# Patient Record
Sex: Female | Born: 1995 | Race: White | Hispanic: Yes | Marital: Single | Smoking: Never smoker
Health system: Southern US, Community
[De-identification: ages and names within clinical notes are randomized; demographics above are authoritative.]

## PROBLEM LIST (undated history)

## (undated) DIAGNOSIS — N946 Dysmenorrhea, unspecified: Principal | ICD-10-CM

## (undated) DIAGNOSIS — Z7689 Persons encountering health services in other specified circumstances: Principal | ICD-10-CM

## (undated) HISTORY — PX: OTHER SURGICAL HISTORY: SHX169

## (undated) HISTORY — DX: Dysmenorrhea, unspecified: N94.6

## (undated) HISTORY — DX: Persons encountering health services in other specified circumstances: Z76.89

---
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
## 2013-08-26 ENCOUNTER — Ambulatory Visit (INDEPENDENT_AMBULATORY_CARE_PROVIDER_SITE_OTHER): Payer: Medicaid Other | Admitting: Adult Health

## 2013-08-26 ENCOUNTER — Encounter: Payer: Self-pay | Admitting: Adult Health

## 2013-08-26 VITALS — BP 124/80 | Ht 61.0 in | Wt 118.0 lb

## 2013-08-26 DIAGNOSIS — Z7689 Persons encountering health services in other specified circumstances: Secondary | ICD-10-CM

## 2013-08-26 DIAGNOSIS — Z3202 Encounter for pregnancy test, result negative: Secondary | ICD-10-CM

## 2013-08-26 DIAGNOSIS — N946 Dysmenorrhea, unspecified: Secondary | ICD-10-CM

## 2013-08-26 HISTORY — DX: Dysmenorrhea, unspecified: N94.6

## 2013-08-26 LAB — POCT URINE PREGNANCY: Preg Test, Ur: NEGATIVE

## 2013-08-26 MED ORDER — IBUPROFEN 800 MG PO TABS: 800.0000 mg | ORAL_TABLET | Freq: Three times a day (TID) | ORAL | Status: DC | PRN

## 2013-08-26 MED ORDER — NORGESTIM-ETH ESTRAD TRIPHASIC 0.18/0.215/0.25 MG-35 MCG PO TABS: 1.0000 | ORAL_TABLET | Freq: Every day | ORAL | Status: DC

## 2013-08-26 NOTE — Progress Notes (Signed)
Subjective:     Patient ID: Vicki Maxwell, female   DOB: 1996-03-08, 18 y.o.   MRN: [id number]  HPI Vicki Maxwell is a 18 year old Hispanic female in complaining of period cramps.She started at age 20-11 and they have been regular and last about 3-4 days but now are more cramp and painful, has felt nauseated at times.Denies migraines with aura.She has never had sex.  Review of Systems See HPI Reviewed past medical,surgical, social and family history. Reviewed medications and allergies.     Objective:   Physical Exam BP 124/80  Ht 5\' 1"  (1.549 m)  Wt 118 lb (53.524 kg)  BMI 22.31 kg/m2  LMP 08/02/2013   UPT negative, talk only, will try OCs to help with cramping,risk/ benefits discussed and she wants to try.  Assessment:     Dysmenorrhea Period management    Plan:     Rx tri sprintec disp 1 pack take 1 daily refilled x 1 year,start with next menses Rx motrin 800 mg #60 1 every 8 hours prn cramps with 1 refill Follow up in 3 months Review handout on OC use  Call with any questuions

## 2013-08-26 NOTE — Patient Instructions (Signed)
Oral Contraception Use Oral contraceptive pills (OCPs) are medicines taken to prevent pregnancy. OCPs work by preventing the ovaries from releasing eggs. The hormones in OCPs also cause the cervical mucus to thicken, preventing the sperm from entering the uterus. The hormones also cause the uterine lining to become thin, not allowing a fertilized egg to attach to the inside of the uterus. OCPs are highly effective when taken exactly as prescribed. However, OCPs do not prevent sexually transmitted diseases (STDs). Safe sex practices, such as using condoms along with an OCP, can help prevent STDs. Before taking OCPs, you may have a physical exam and Pap test. Your health care provider may also order blood tests if necessary. Your health care provider will make sure you are a good candidate for oral contraception. Discuss with your health care provider the possible side effects of the OCP you may be prescribed. When starting an OCP, it can take 2 to 3 months for the body to adjust to the changes in hormone levels in your body.  HOW TO TAKE ORAL CONTRACEPTIVE PILLS Your health care provider may advise you on how to start taking the first cycle of OCPs. Otherwise, you can:   Start on day 1 of your menstrual period. You will not need any backup contraceptive protection with this start time.   Start on the first Sunday after your menstrual period or the day you get your prescription. In these cases, you will need to use backup contraceptive protection for the first week.   Start the pill at any time of your cycle. If you take the pill within 5 days of the start of your period, you are protected against pregnancy right away. In this case, you will not need a backup form of birth control. If you start at any other time of your menstrual cycle, you will need to use another form of birth control for 7 days. If your OCP is the type called a minipill, it will protect you from pregnancy after taking it for 2 days (48  hours). After you have started taking OCPs:   If you forget to take 1 pill, take it as soon as you remember. Take the next pill at the regular time.   If you miss 2 or more pills, call your health care provider because different pills have different instructions for missed doses. Use backup birth control until your next menstrual period starts.   If you use a 28-day pack that contains inactive pills and you miss 1 of the last 7 pills (pills with no hormones), it will not matter. Throw away the rest of the nonhormone pills and start a new pill pack.  No matter which day you start the OCP, you will always start a new pack on that same day of the week. Have an extra pack of OCPs and a backup contraceptive method available in case you miss some pills or lose your OCP pack.  HOME CARE INSTRUCTIONS   Do not smoke.   Always use a condom to protect against STDs. OCPs do not protect against STDs.   Use a calendar to mark your menstrual period days.   Read the information and directions that came with your OCP. Talk to your health care provider if you have questions.  SEEK MEDICAL CARE IF:   You develop nausea and vomiting.   You have abnormal vaginal discharge or bleeding.   You develop a rash.   You miss your menstrual period.   You are losing   your hair.   You need treatment for mood swings or depression.   You get dizzy when taking the OCP.   You develop acne from taking the OCP.   You become pregnant.  SEEK IMMEDIATE MEDICAL CARE IF:   You develop chest pain.   You develop shortness of breath.   You have an uncontrolled or severe headache.   You develop numbness or slurred speech.   You develop visual problems.   You develop pain, redness, and swelling in the legs.  Document Released: 04/05/2011 Document Revised: 12/17/2012 Document Reviewed: 10/05/2012 [facility] Patient Information 2014 [employer] [place]. Start pills with next menses Follow up in 3  months Call any time with questions Try motrin

## 2013-11-25 ENCOUNTER — Ambulatory Visit (INDEPENDENT_AMBULATORY_CARE_PROVIDER_SITE_OTHER): Payer: Medicaid Other | Admitting: Adult Health

## 2013-11-25 ENCOUNTER — Encounter: Payer: Self-pay | Admitting: Adult Health

## 2013-11-25 VITALS — BP 102/64 | Ht 61.5 in | Wt 121.5 lb

## 2013-11-25 DIAGNOSIS — Z7689 Persons encountering health services in other specified circumstances: Secondary | ICD-10-CM

## 2013-11-25 HISTORY — DX: Persons encountering health services in other specified circumstances: Z76.89

## 2013-11-25 NOTE — Progress Notes (Signed)
Subjective:     Patient ID: Vicki HarmanLorena Kinnick, female   DOB: 1995/12/13, 18 y.o.   MRN: [id number]  HPI Era BumpersLorena is a 18 year old Hispanic female back for follow up after starting OCs for dysmenorrhea.  Review of Systems See HPI Reviewed past medical,surgical, social and family history. Reviewed medications and allergies.     Objective:   Physical Exam BP 102/64  Ht 5' 1.5" (1.562 m)  Wt 121 lb 8 oz (55.112 kg)  BMI 22.59 kg/m2  LMP 10/31/2013   Talk only, periods are on time and no more pain, so she is happy and wants to continue them.  Assessment:     Period management    Plan:     Continue OCs Follow up in April 2016 or before if needed   Pap at 4521

## 2013-11-25 NOTE — Patient Instructions (Signed)
Continue pills  Follow up in April

## 2014-06-14 ENCOUNTER — Other Ambulatory Visit: Payer: Self-pay | Admitting: Adult Health

## 2014-09-20 ENCOUNTER — Other Ambulatory Visit: Payer: Self-pay | Admitting: Adult Health

## 2014-11-03 ENCOUNTER — Ambulatory Visit: Payer: Medicaid Other | Admitting: Adult Health

## 2014-11-18 ENCOUNTER — Ambulatory Visit (INDEPENDENT_AMBULATORY_CARE_PROVIDER_SITE_OTHER): Payer: Medicaid Other | Admitting: Adult Health

## 2014-11-18 ENCOUNTER — Encounter: Payer: Self-pay | Admitting: Adult Health

## 2014-11-18 VITALS — BP 90/60 | HR 84 | Ht 64.2 in | Wt 129.4 lb

## 2014-11-18 DIAGNOSIS — Z309 Encounter for contraceptive management, unspecified: Secondary | ICD-10-CM | POA: Diagnosis not present

## 2014-11-18 DIAGNOSIS — Z7689 Persons encountering health services in other specified circumstances: Secondary | ICD-10-CM

## 2014-11-18 MED ORDER — NORGESTIM-ETH ESTRAD TRIPHASIC 0.18/0.215/0.25 MG-35 MCG PO TABS: 1.0000 | ORAL_TABLET | Freq: Every day | ORAL | Status: DC

## 2014-11-18 NOTE — Patient Instructions (Signed)
Take OCs daily  If has sex use condoms will get pap at 81

## 2014-11-18 NOTE — Progress Notes (Signed)
Subjective:     Patient ID: Vicki Maxwell, female   DOB: [birth year], 19 y.o.   MRN: [id number]  HPI Mahika is a 19 year old Hispanic female in to get OCs refilled, periods are better, no cramps, but has missed 2 months, due to needing refills.She had wisdom teeth removed yesterday.  Review of Systems  Patient denies any headaches, hearing loss, fatigue, blurred vision, shortness of breath, chest pain, abdominal pain, problems with bowel movements, urination, or intercourse(has not had sex). No joint pain or mood swings. Reviewed past medical,surgical, social and family history. Reviewed medications and allergies.     Objective:   Physical Exam BP 90/60 mmHg  Pulse 84  Ht 5' 4.2" (1.631 m)  Wt 129 lb 6.4 oz (58.695 kg)  BMI 22.06 kg/m2  LMP 11/13/2014  Skin warm and dry. Neck: mid line trachea, normal thyroid, good ROM, no lymphadenopathy noted. Lungs: clear to ausculation bilaterally. Cardiovascular: regular rate and rhythm.Discussed that she can call for refills or have pharmacy request, do not not take pills.She is thinking about sex in the future, discussed has to be on pills for 1 month before effective and use condoms for STD prevention ,can start back on pills Sunday.    Assessment:     Period management     Plan:     Rx tri sprintec disp 1 pack take 1 daily with 11 refills Return in December for pap Use condoms

## 2015-04-20 ENCOUNTER — Other Ambulatory Visit: Payer: Medicaid Other | Admitting: Adult Health

## 2015-05-11 ENCOUNTER — Other Ambulatory Visit: Payer: Medicaid Other | Admitting: Adult Health

## 2015-05-16 ENCOUNTER — Encounter: Payer: Self-pay | Admitting: Adult Health

## 2015-05-16 ENCOUNTER — Other Ambulatory Visit (HOSPITAL_COMMUNITY)
Admission: RE | Admit: 2015-05-16 | Discharge: 2015-05-16 | Disposition: A | Discharge status: Home / Self Care | Payer: BLUE CROSS/BLUE SHIELD | Source: Ambulatory Visit | Attending: Adult Health | Admitting: Adult Health

## 2015-05-16 ENCOUNTER — Ambulatory Visit (INDEPENDENT_AMBULATORY_CARE_PROVIDER_SITE_OTHER): Payer: BLUE CROSS/BLUE SHIELD | Admitting: Adult Health

## 2015-05-16 VITALS — BP 120/70 | HR 58 | Ht 60.0 in | Wt 140.5 lb

## 2015-05-16 DIAGNOSIS — Z01419 Encounter for gynecological examination (general) (routine) without abnormal findings: Secondary | ICD-10-CM | POA: Diagnosis not present

## 2015-05-16 DIAGNOSIS — Z113 Encounter for screening for infections with a predominantly sexual mode of transmission: Secondary | ICD-10-CM | POA: Diagnosis present

## 2015-05-16 DIAGNOSIS — Z7689 Persons encountering health services in other specified circumstances: Secondary | ICD-10-CM

## 2015-05-16 MED ORDER — NORGESTIM-ETH ESTRAD TRIPHASIC 0.18/0.215/0.25 MG-35 MCG PO TABS: 1.0000 | ORAL_TABLET | Freq: Every day | ORAL | Status: DC

## 2015-05-16 NOTE — Progress Notes (Signed)
Patient ID: Vicki Maxwell, female   DOB: 31-Dec-1995, 20 y.o.   MRN: [id number] History of Present Illness: Era BumpersLorena is a 20 year old His[panic female, in for well woman gyn exam and first pap, she is happy with her pills.  Current Medications, Allergies, Past Medical History, Past Surgical History, Family History and Social History were reviewed in [facility] record.     Review of Systems: Patient denies any headaches, hearing loss, fatigue, blurred vision, shortness of breath, chest pain, abdominal pain, problems with bowel movements, urination, or intercourse. No joint pain or mood swings.    Physical Exam:BP 120/70 mmHg  Pulse 58  Ht 5' (1.524 m)  Wt 140 lb 8 oz (63.73 kg)  BMI 27.44 kg/m2  LMP 05/04/2015 General:  Well developed, well nourished, no acute distress Skin:  Warm and dry Neck:  Midline trachea, normal thyroid, good ROM, no lymphadenopathy Lungs; Clear to auscultation bilaterally Breast:  No dominant palpable mass, retraction, or nipple discharge Cardiovascular: Regular rate and rhythm Abdomen:  Soft, non tender, no hepatosplenomegaly Pelvic:  External genitalia is normal in appearance, no lesions.  The vagina is normal in appearance. Urethra has no lesions or masses. The cervix is tiny and smooth, pap with GC/CHL performed.  Uterus is felt to be normal size, shape, and contour.  No adnexal masses or tenderness noted.Bladder is non tender, no masses felt. Extremities/musculoskeletal:  No swelling or varicosities noted, no clubbing or cyanosis Psych:  No mood changes, alert and cooperative,seems happy   Impression: Well woman gyn exam with pap Period management    Plan: Refilled tri sprintec x 1 year    Physical in 1 year, app in 3 if normal

## 2015-05-16 NOTE — Patient Instructions (Signed)
Continue tri sprintec Physical in 1 year, pap in 3 if normal

## 2015-05-17 LAB — CYTOLOGY - PAP

## 2017-04-30 NOTE — L&D Delivery Note (Signed)
Delivery Note At  a viable female was delivered via  (Presentation:vertex ;LOA  ).  APGAR:9 ,9 ; weight  .   Placenta status:spont ,shultz .  Cord:3vc  with the following complications:none .  Cord pH: n/a  Anesthesia:nne   Episiotomy:  nne Lacerations:  none Suture Repair: n/a Est. Blood Loss 200(mL):    Mom to postpartum.  Baby to Couplet care / Skin to Skin.  Wyvonnia DuskyMarie Candiace West 12/15/2017, 8:06 PM

## 2017-05-10 ENCOUNTER — Encounter: Payer: Self-pay | Admitting: Adult Health

## 2017-05-10 ENCOUNTER — Ambulatory Visit (INDEPENDENT_AMBULATORY_CARE_PROVIDER_SITE_OTHER): Payer: Medicaid Other | Admitting: Adult Health

## 2017-05-10 VITALS — BP 122/60 | HR 56 | Ht 61.0 in | Wt 129.0 lb

## 2017-05-10 DIAGNOSIS — N926 Irregular menstruation, unspecified: Secondary | ICD-10-CM | POA: Diagnosis not present

## 2017-05-10 DIAGNOSIS — Z3201 Encounter for pregnancy test, result positive: Secondary | ICD-10-CM

## 2017-05-10 DIAGNOSIS — Z3490 Encounter for supervision of normal pregnancy, unspecified, unspecified trimester: Secondary | ICD-10-CM

## 2017-05-10 DIAGNOSIS — O3680X0 Pregnancy with inconclusive fetal viability, not applicable or unspecified: Secondary | ICD-10-CM

## 2017-05-10 DIAGNOSIS — R112 Nausea with vomiting, unspecified: Secondary | ICD-10-CM | POA: Diagnosis not present

## 2017-05-10 LAB — POCT URINE PREGNANCY: PREG TEST UR: POSITIVE — AB

## 2017-05-10 MED ORDER — PRENATAL PLUS IRON 29-1 MG PO TABS: ORAL_TABLET | ORAL | 12 refills | Status: DC

## 2017-05-10 NOTE — Progress Notes (Signed)
Patient ID: Vicki Maxwell, female   DOB: [birth year], 22 y.o.   MRN: [id number] History of Present Illness:  Vicki BumpersLorena is a Hispanic 22 year old in for UPT, has missed a period and had 1+HPT. She is teary over this today, she does not think she wants a baby right now, boyfriend is fine.  Current Medications, Allergies, Past Medical History, Past Surgical History, Family History and Social History were reviewed in [facility] electronic medical record.     Review of Systems: +missed period with +HPT +nausea and vomited x 3     Physical Exam:BP 122/60 (BP Location: Left Arm, Patient Position: Sitting, Cuff Size: Normal)   Pulse (!) 56   Ht 5\' 1"  (1.549 m)   Wt 129 lb (58.5 kg)   LMP 03/08/2017   BMI 24.37 kg/m UPT +, about 9 weeks by LMP with EDD 12/13/17. General:  Well developed, well nourished, no acute distress Skin:  Warm and dry Neck:  Midline trachea, normal thyroid, good ROM, no lymphadenopathy Lungs; Clear to auscultation bilaterally Cardiovascular: Regular rate and rhythm Abdomen:  Soft, non tender, no hepatosplenomegaly Will get US in 1 week for dating.Did talk with her about adoption instead of termination as option, she is not sure what she will do yet.    Impression: 1. Pregnancy examination or test, positive result   2. Pregnancy, unspecified gestational age   13. Encounter to determine fetal viability of pregnancy, single or unspecified fetus       Plan: Meds ordered this encounter  Medications  . Prenatal Vit-Iron Carbonyl-FA (PRENATAL PLUS IRON) 29-1 MG TABS    Sig: Take 1 daily    Dispense:  30 tablet    Refill:  12    Order Specific Question:   Supervising Provider    Answer:   Lazaro ArmsEURE, LUTHER H [[id number]]  Eat often  Return in 1 week for dating US Review handout on First trimester and by [facility]

## 2017-05-10 NOTE — Patient Instructions (Signed)
First Trimester of Pregnancy The first trimester of pregnancy is from week 1 until the end of week 13 (months 1 through 3). A week after a sperm fertilizes an egg, the egg will implant on the wall of the uterus. This embryo will begin to develop into a baby. Genes from you and your partner will form the baby. The female genes will determine whether the baby will be a boy or a girl. At 6-8 weeks, the eyes and face will be formed, and the heartbeat can be seen on ultrasound. At the end of 12 weeks, all the baby's organs will be formed. Now that you are pregnant, you will want to do everything you can to have a healthy baby. Two of the most important things are to get good prenatal care and to follow your health care provider's instructions. Prenatal care is all the medical care you receive before the baby's birth. This care will help prevent, find, and treat any problems during the pregnancy and childbirth. Body changes during your first trimester Your body goes through many changes during pregnancy. The changes vary from woman to woman.  You may gain or lose a couple of pounds at first.  You may feel sick to your stomach (nauseous) and you may throw up (vomit). If the vomiting is uncontrollable, call your health care provider.  You may tire easily.  You may develop headaches that can be relieved by medicines. All medicines should be approved by your health care provider.  You may urinate more often. Painful urination may mean you have a bladder infection.  You may develop heartburn as a result of your pregnancy.  You may develop constipation because certain hormones are causing the muscles that push stool through your intestines to slow down.  You may develop hemorrhoids or swollen veins (varicose veins).  Your breasts may begin to grow larger and become tender. Your nipples may stick out more, and the tissue that surrounds them (areola) may become darker.  Your gums may bleed and may be  sensitive to brushing and flossing.  Dark spots or blotches (chloasma, mask of pregnancy) may develop on your face. This will likely fade after the baby is born.  Your menstrual periods will stop.  You may have a loss of appetite.  You may develop cravings for certain kinds of food.  You may have changes in your emotions from day to day, such as being excited to be pregnant or being concerned that something may go wrong with the pregnancy and baby.  You may have more vivid and strange dreams.  You may have changes in your hair. These can include thickening of your hair, rapid growth, and changes in texture. Some women also have hair loss during or after pregnancy, or hair that feels dry or thin. Your hair will most likely return to normal after your baby is born.  What to expect at prenatal visits During a routine prenatal visit:  You will be weighed to make sure you and the baby are growing normally.  Your blood pressure will be taken.  Your abdomen will be measured to track your baby's growth.  The fetal heartbeat will be listened to between weeks 10 and 14 of your pregnancy.  Test results from any previous visits will be discussed.  Your health care provider may ask you:  How you are feeling.  If you are feeling the baby move.  If you have had any abnormal symptoms, such as leaking fluid, bleeding, severe headaches,   or abdominal cramping.  If you are using any tobacco products, including cigarettes, chewing tobacco, and electronic cigarettes.  If you have any questions.  Other tests that may be performed during your first trimester include:  Blood tests to find your blood type and to check for the presence of any previous infections. The tests will also be used to check for low iron levels (anemia) and protein on red blood cells (Rh antibodies). Depending on your risk factors, or if you previously had diabetes during pregnancy, you may have tests to check for high blood  sugar that affects pregnant women (gestational diabetes).  Urine tests to check for infections, diabetes, or protein in the urine.  An ultrasound to confirm the proper growth and development of the baby.  Fetal screens for spinal cord problems (spina bifida) and Down syndrome.  HIV (human immunodeficiency virus) testing. Routine prenatal testing includes screening for HIV, unless you choose not to have this test.  You may need other tests to make sure you and the baby are doing well.  Follow these instructions at home: Medicines  Follow your health care provider's instructions regarding medicine use. Specific medicines may be either safe or unsafe to take during pregnancy.  Take a prenatal vitamin that contains at least 600 micrograms (mcg) of folic acid.  If you develop constipation, try taking a stool softener if your health care provider approves. Eating and drinking  Eat a balanced diet that includes fresh fruits and vegetables, whole grains, good sources of protein such as meat, eggs, or tofu, and low-fat dairy. Your health care provider will help you determine the amount of weight gain that is right for you.  Avoid raw meat and uncooked cheese. These carry germs that can cause birth defects in the baby.  Eating four or five small meals rather than three large meals a day may help relieve nausea and vomiting. If you start to feel nauseous, eating a few soda crackers can be helpful. Drinking liquids between meals, instead of during meals, also seems to help ease nausea and vomiting.  Limit foods that are high in fat and processed sugars, such as fried and sweet foods.  To prevent constipation: ? Eat foods that are high in fiber, such as fresh fruits and vegetables, whole grains, and beans. ? Drink enough fluid to keep your urine clear or pale yellow. Activity  Exercise only as directed by your health care provider. Most women can continue their usual exercise routine during  pregnancy. Try to exercise for 30 minutes at least 5 days a week. Exercising will help you: ? Control your weight. ? Stay in shape. ? Be prepared for labor and delivery.  Experiencing pain or cramping in the lower abdomen or lower back is a good sign that you should stop exercising. Check with your health care provider before continuing with normal exercises.  Try to avoid standing for long periods of time. Move your legs often if you must stand in one place for a long time.  Avoid heavy lifting.  Wear low-heeled shoes and practice good posture.  You may continue to have sex unless your health care provider tells you not to. Relieving pain and discomfort  Wear a good support bra to relieve breast tenderness.  Take warm sitz baths to soothe any pain or discomfort caused by hemorrhoids. Use hemorrhoid cream if your health care provider approves.  Rest with your legs elevated if you have leg cramps or low back pain.  If you develop   varicose veins in your legs, wear support hose. Elevate your feet for 15 minutes, 3-4 times a day. Limit salt in your diet. Prenatal care  Schedule your prenatal visits by the twelfth week of pregnancy. They are usually scheduled monthly at first, then more often in the last 2 months before delivery.  Write down your questions. Take them to your prenatal visits.  Keep all your prenatal visits as told by your health care provider. This is important. Safety  Wear your seat belt at all times when driving.  Make a list of emergency phone numbers, including numbers for family, friends, the hospital, and police and fire departments. General instructions  Ask your health care provider for a referral to a local prenatal education class. Begin classes no later than the beginning of month 6 of your pregnancy.  Ask for help if you have counseling or nutritional needs during pregnancy. Your health care provider can offer advice or refer you to specialists for help  with various needs.  Do not use hot tubs, steam rooms, or saunas.  Do not douche or use tampons or scented sanitary pads.  Do not cross your legs for long periods of time.  Avoid cat litter boxes and soil used by cats. These carry germs that can cause birth defects in the baby and possibly loss of the fetus by miscarriage or stillbirth.  Avoid all smoking, herbs, alcohol, and medicines not prescribed by your health care provider. Chemicals in these products affect the formation and growth of the baby.  Do not use any products that contain nicotine or tobacco, such as cigarettes and e-cigarettes. If you need help quitting, ask your health care provider. You may receive counseling support and other resources to help you quit.  Schedule a dentist appointment. At home, brush your teeth with a soft toothbrush and be gentle when you floss. Contact a health care provider if:  You have dizziness.  You have mild pelvic cramps, pelvic pressure, or nagging pain in the abdominal area.  You have persistent nausea, vomiting, or diarrhea.  You have a bad smelling vaginal discharge.  You have pain when you urinate.  You notice increased swelling in your face, hands, legs, or ankles.  You are exposed to fifth disease or chickenpox.  You are exposed to German measles (rubella) and have never had it. Get help right away if:  You have a fever.  You are leaking fluid from your vagina.  You have spotting or bleeding from your vagina.  You have severe abdominal cramping or pain.  You have rapid weight gain or loss.  You vomit blood or material that looks like coffee grounds.  You develop a severe headache.  You have shortness of breath.  You have any kind of trauma, such as from a fall or a car accident. Summary  The first trimester of pregnancy is from week 1 until the end of week 13 (months 1 through 3).  Your body goes through many changes during pregnancy. The changes vary from  woman to woman.  You will have routine prenatal visits. During those visits, your health care provider will examine you, discuss any test results you may have, and talk with you about how you are feeling. This information is not intended to replace advice given to you by your health care provider. Make sure you discuss any questions you have with your health care provider. Document Released: 04/10/2001 Document Revised: 03/28/2016 Document Reviewed: 03/28/2016 [employer] Interactive Patient Education  2018 [employer]  

## 2017-05-20 ENCOUNTER — Ambulatory Visit (INDEPENDENT_AMBULATORY_CARE_PROVIDER_SITE_OTHER): Payer: BLUE CROSS/BLUE SHIELD

## 2017-05-20 DIAGNOSIS — Z3A10 10 weeks gestation of pregnancy: Secondary | ICD-10-CM

## 2017-05-20 DIAGNOSIS — O3680X0 Pregnancy with inconclusive fetal viability, not applicable or unspecified: Secondary | ICD-10-CM | POA: Diagnosis not present

## 2017-05-20 NOTE — Progress Notes (Signed)
US 10+3 wks,single IUP,anterior pl gr 0,normal ovaries bilat,crl 37.26 mm,fhr 169 bpm

## 2017-05-29 ENCOUNTER — Ambulatory Visit (INDEPENDENT_AMBULATORY_CARE_PROVIDER_SITE_OTHER): Payer: BLUE CROSS/BLUE SHIELD | Admitting: Advanced Practice Midwife

## 2017-05-29 ENCOUNTER — Ambulatory Visit: Payer: BLUE CROSS/BLUE SHIELD | Admitting: *Deleted

## 2017-05-29 ENCOUNTER — Encounter: Payer: Self-pay | Admitting: Advanced Practice Midwife

## 2017-05-29 VITALS — BP 127/78 | HR 76 | Wt 131.0 lb

## 2017-05-29 DIAGNOSIS — Z23 Encounter for immunization: Secondary | ICD-10-CM

## 2017-05-29 DIAGNOSIS — Z3A11 11 weeks gestation of pregnancy: Secondary | ICD-10-CM | POA: Diagnosis not present

## 2017-05-29 DIAGNOSIS — Z3401 Encounter for supervision of normal first pregnancy, first trimester: Secondary | ICD-10-CM | POA: Diagnosis not present

## 2017-05-29 DIAGNOSIS — Z1389 Encounter for screening for other disorder: Secondary | ICD-10-CM

## 2017-05-29 DIAGNOSIS — Z3400 Encounter for supervision of normal first pregnancy, unspecified trimester: Secondary | ICD-10-CM | POA: Insufficient documentation

## 2017-05-29 DIAGNOSIS — Z331 Pregnant state, incidental: Secondary | ICD-10-CM

## 2017-05-29 LAB — POCT URINALYSIS DIPSTICK
Blood, UA: NEGATIVE
Glucose, UA: NEGATIVE
Ketones, UA: NEGATIVE
LEUKOCYTES UA: NEGATIVE
Nitrite, UA: NEGATIVE
PROTEIN UA: NEGATIVE

## 2017-05-29 NOTE — Patient Instructions (Addendum)
 First Trimester of Pregnancy The first trimester of pregnancy is from week 1 until the end of week 12 (months 1 through 3). A week after a sperm fertilizes an egg, the egg will implant on the wall of the uterus. This embryo will begin to develop into a baby. Genes from you and your partner are forming the baby. The female genes determine whether the baby is a boy or a girl. At 6-8 weeks, the eyes and face are formed, and the heartbeat can be seen on ultrasound. At the end of 12 weeks, all the baby's organs are formed.  Now that you are pregnant, you will want to do everything you can to have a healthy baby. Two of the most important things are to get good prenatal care and to follow your health care provider's instructions. Prenatal care is all the medical care you receive before the baby's birth. This care will help prevent, find, and treat any problems during the pregnancy and childbirth. BODY CHANGES Your body goes through many changes during pregnancy. The changes vary from woman to woman.   You may gain or lose a couple of pounds at first.  You may feel sick to your stomach (nauseous) and throw up (vomit). If the vomiting is uncontrollable, call your health care provider.  You may tire easily.  You may develop headaches that can be relieved by medicines approved by your health care provider.  You may urinate more often. Painful urination may mean you have a bladder infection.  You may develop heartburn as a result of your pregnancy.  You may develop constipation because certain hormones are causing the muscles that push waste through your intestines to slow down.  You may develop hemorrhoids or swollen, bulging veins (varicose veins).  Your breasts may begin to grow larger and become tender. Your nipples may stick out more, and the tissue that surrounds them (areola) may become darker.  Your gums may bleed and may be sensitive to brushing and flossing.  Dark spots or blotches  (chloasma, mask of pregnancy) may develop on your face. This will likely fade after the baby is born.  Your menstrual periods will stop.  You may have a loss of appetite.  You may develop cravings for certain kinds of food.  You may have changes in your emotions from day to day, such as being excited to be pregnant or being concerned that something may go wrong with the pregnancy and baby.  You may have more vivid and strange dreams.  You may have changes in your hair. These can include thickening of your hair, rapid growth, and changes in texture. Some women also have hair loss during or after pregnancy, or hair that feels dry or thin. Your hair will most likely return to normal after your baby is born. WHAT TO EXPECT AT YOUR PRENATAL VISITS During a routine prenatal visit:  You will be weighed to make sure you and the baby are growing normally.  Your blood pressure will be taken.  Your abdomen will be measured to track your baby's growth.  The fetal heartbeat will be listened to starting around week 10 or 12 of your pregnancy.  Test results from any previous visits will be discussed. Your health care provider may ask you:  How you are feeling.  If you are feeling the baby move.  If you have had any abnormal symptoms, such as leaking fluid, bleeding, severe headaches, or abdominal cramping.  If you have any questions. Other   tests that may be performed during your first trimester include:  Blood tests to find your blood type and to check for the presence of any previous infections. They will also be used to check for low iron levels (anemia) and Rh antibodies. Later in the pregnancy, blood tests for diabetes will be done along with other tests if problems develop.  Urine tests to check for infections, diabetes, or protein in the urine.  An ultrasound to confirm the proper growth and development of the baby.  An amniocentesis to check for possible genetic problems.  Fetal  screens for spina bifida and Down syndrome.  You may need other tests to make sure you and the baby are doing well. HOME CARE INSTRUCTIONS  Medicines  Follow your health care provider's instructions regarding medicine use. Specific medicines may be either safe or unsafe to take during pregnancy.  Take your prenatal vitamins as directed.  If you develop constipation, try taking a stool softener if your health care provider approves. Diet  Eat regular, well-balanced meals. Choose a variety of foods, such as meat or vegetable-based protein, fish, milk and low-fat dairy products, vegetables, fruits, and whole grain breads and cereals. Your health care provider will help you determine the amount of weight gain that is right for you.  Avoid raw meat and uncooked cheese. These carry germs that can cause birth defects in the baby.  Eating four or five small meals rather than three large meals a day may help relieve nausea and vomiting. If you start to feel nauseous, eating a few soda crackers can be helpful. Drinking liquids between meals instead of during meals also seems to help nausea and vomiting.  If you develop constipation, eat more high-fiber foods, such as fresh vegetables or fruit and whole grains. Drink enough fluids to keep your urine clear or pale yellow. Activity and Exercise  Exercise only as directed by your health care provider. Exercising will help you:  Control your weight.  Stay in shape.  Be prepared for labor and delivery.  Experiencing pain or cramping in the lower abdomen or low back is a good sign that you should stop exercising. Check with your health care provider before continuing normal exercises.  Try to avoid standing for long periods of time. Move your legs often if you must stand in one place for a long time.  Avoid heavy lifting.  Wear low-heeled shoes, and practice good posture.  You may continue to have sex unless your health care provider directs you  otherwise. Relief of Pain or Discomfort  Wear a good support bra for breast tenderness.   Take warm sitz baths to soothe any pain or discomfort caused by hemorrhoids. Use hemorrhoid cream if your health care provider approves.   Rest with your legs elevated if you have leg cramps or low back pain.  If you develop varicose veins in your legs, wear support hose. Elevate your feet for 15 minutes, 3-4 times a day. Limit salt in your diet. Prenatal Care  Schedule your prenatal visits by the twelfth week of pregnancy. They are usually scheduled monthly at first, then more often in the last 2 months before delivery.  Write down your questions. Take them to your prenatal visits.  Keep all your prenatal visits as directed by your health care provider. Safety  Wear your seat belt at all times when driving.  Make a list of emergency phone numbers, including numbers for family, friends, the hospital, and police and fire departments. General   Tips  Ask your health care provider for a referral to a local prenatal education class. Begin classes no later than at the beginning of month 6 of your pregnancy.  Ask for help if you have counseling or nutritional needs during pregnancy. Your health care provider can offer advice or refer you to specialists for help with various needs.  Do not use hot tubs, steam rooms, or saunas.  Do not douche or use tampons or scented sanitary pads.  Do not cross your legs for long periods of time.  Avoid cat litter boxes and soil used by cats. These carry germs that can cause birth defects in the baby and possibly loss of the fetus by miscarriage or stillbirth.  Avoid all smoking, herbs, alcohol, and medicines not prescribed by your health care provider. Chemicals in these affect the formation and growth of the baby.  Schedule a dentist appointment. At home, brush your teeth with a soft toothbrush and be gentle when you floss. SEEK MEDICAL CARE IF:   You have  dizziness.  You have mild pelvic cramps, pelvic pressure, or nagging pain in the abdominal area.  You have persistent nausea, vomiting, or diarrhea.  You have a bad smelling vaginal discharge.  You have pain with urination.  You notice increased swelling in your face, hands, legs, or ankles. SEEK IMMEDIATE MEDICAL CARE IF:   You have a fever.  You are leaking fluid from your vagina.  You have spotting or bleeding from your vagina.  You have severe abdominal cramping or pain.  You have rapid weight gain or loss.  You vomit blood or material that looks like coffee grounds.  You are exposed to German measles and have never had them.  You are exposed to fifth disease or chickenpox.  You develop a severe headache.  You have shortness of breath.  You have any kind of trauma, such as from a fall or a car accident. Document Released: 04/10/2001 Document Revised: 08/31/2013 Document Reviewed: 02/24/2013 [employer] Patient Information 2015 [employer]. This information is not intended to replace advice given to you by your health care provider. Make sure you discuss any questions you have with your health care provider.   Nausea & Vomiting  Have saltine crackers or pretzels by your bed and eat a few bites before you raise your head out of bed in the morning  Eat small frequent meals throughout the day instead of large meals  Drink plenty of fluids throughout the day to stay hydrated, just don't drink a lot of fluids with your meals.  This can make your stomach fill up faster making you feel sick  Do not brush your teeth right after you eat  Products with real ginger are good for nausea, like ginger ale and ginger hard candy Make sure it says made with real ginger!  Sucking on sour candy like lemon heads is also good for nausea  If your prenatal vitamins make you nauseated, take them at night so you will sleep through the nausea  Sea Bands  If you feel like you need  medicine for the nausea & vomiting please let us know  If you are unable to keep any fluids or food down please let us know   Constipation  Drink plenty of fluid, preferably water, throughout the day  Eat foods high in fiber such as fruits, vegetables, and grains  Exercise, such as walking, is a good way to keep your bowels regular  Drink warm fluids, especially warm   prune juice, or decaf coffee  Eat a 1/2 cup of real oatmeal (not instant), 1/2 cup applesauce, and 1/2-1 cup warm prune juice every day  If needed, you may take Colace (docusate sodium) stool softener once or twice a day to help keep the stool soft. If you are pregnant, wait until you are out of your first trimester (12-14 weeks of pregnancy)  If you still are having problems with constipation, you may take Miralax once daily as needed to help keep your bowels regular.  If you are pregnant, wait until you are out of your first trimester (12-14 weeks of pregnancy)  Safe Medications in Pregnancy   Acne: Benzoyl Peroxide Salicylic Acid  Backache/Headache: Tylenol: 2 regular strength every 4 hours OR              2 Extra strength every 6 hours  Colds/Coughs/Allergies: Benadryl (alcohol free) 25 mg every 6 hours as needed Breath right strips Claritin Cepacol throat lozenges Chloraseptic throat spray Cold-Eeze- up to three times per day Cough drops, alcohol free Flonase (by prescription only) Guaifenesin Mucinex Robitussin DM (plain only, alcohol free) Saline nasal spray/drops Sudafed (pseudoephedrine) & Actifed ** use only after [redacted] weeks gestation and if you do not have high blood pressure Tylenol Vicks Vaporub Zinc lozenges Zyrtec   Constipation: Colace Ducolax suppositories Fleet enema Glycerin suppositories Metamucil Milk of magnesia Miralax Senokot Smooth move tea  Diarrhea: Kaopectate Imodium A-D  *NO pepto Bismol  Hemorrhoids: Anusol Anusol HC Preparation  H Tucks  Indigestion: Tums Maalox Mylanta Zantac  Pepcid  Insomnia: Benadryl (alcohol free) 25mg  every 6 hours as needed Tylenol PM Unisom, no Gelcaps  Leg Cramps: Tums MagGel  Nausea/Vomiting:  Bonine Dramamine Emetrol Ginger extract Sea bands Meclizine  Nausea medication to take during pregnancy:  Unisom (doxylamine succinate 25 mg tablets) Take one tablet daily at bedtime. If symptoms are not adequately controlled, the dose can be increased to a maximum recommended dose of two tablets daily (1/2 tablet in the morning, 1/2 tablet mid-afternoon and one at bedtime). Vitamin B6 100mg  tablets. Take one tablet twice a day (up to 200 mg per day).  Skin Rashes: Aveeno products Benadryl cream or 25mg  every 6 hours as needed Calamine Lotion 1% cortisone cream  Yeast infection: Gyne-lotrimin 7 Monistat 7   **If taking multiple medications, please check labels to avoid duplicating the same active ingredients **take medication as directed on the label ** Do not exceed 4000 mg of tylenol in 24 hours **Do not take medications that contain aspirin or ibuprofen     [phone] is the phone number for Pregnancy Classes or hospital tours at [facility].   You will be referred to  [email or web address] for more information on childbirth classes  At this site you may register for classes. You may sign up for a waiting list if classes are full. Please SIGN UP FOR THIS!.   When the waiting list becomes long, sometimes new classes can be added.

## 2017-05-29 NOTE — Progress Notes (Signed)
  Subjective:    Vicki Maxwell is a G1P0 5340w5d being seen today for her first obstetrical visit.  Her obstetrical history is significant for first pregnancy.  Wasn't sure about it when she first found out, but is doing OK now. .  Pregnancy history fully reviewed.  Patient reports no complaints.  Vitals:   05/29/17 1458  BP: 127/78  Pulse: 76  Weight: 131 lb (59.4 kg)    HISTORY: OB History  Gravida Para Term Preterm AB Living  1            SAB TAB Ectopic Multiple Live Births               # Outcome Date GA Lbr Len/2nd Weight Sex Delivery Anes PTL Lv  1 Current              Past Medical History:  Diagnosis Date  . Dysmenorrhea 08/26/2013  . Menstrual extraction 11/25/2013   Past Surgical History:  Procedure Laterality Date  . removal wisdom tooth     Family History  Problem Relation Age of Onset  . Diabetes Father   . Hyperlipidemia Father   . Diabetes Paternal Grandmother   . Diabetes Paternal Grandfather   . Diabetes Paternal Aunt   . Diabetes Paternal Uncle   . Hyperlipidemia Cousin      Exam                                      System:     Skin: normal coloration and turgor, no rashes    Neurologic: oriented, normal, normal mood   Extremities: normal strength, tone, and muscle mass   HEENT PERRLA   Mouth/Teeth mucous membranes moist, normal dentition   Neck supple and no masses   Cardiovascular: regular rate and rhythm   Respiratory:  appears well, vitals normal, no respiratory distress, acyanotic   Abdomen: soft, non-tender;  FHR: 160 US        The nature of [facility] with multiple MDs and other Advanced Practice Providers was explained to patient; also emphasized that residents, students are part of our team.  Assessment:    Pregnancy: G1P0 Patient Active Problem List   Diagnosis Date Noted  . Supervision of normal first pregnancy 05/29/2017  . Menstrual extraction 11/25/2013  . Dysmenorrhea  08/26/2013        Plan:     Initial labs drawn. Continue prenatal vitamins  Problem list reviewed and updated  Reviewed n/v relief measures and warning s/s to report  Reviewed recommended weight gain based on pre-gravid BMI  Encouraged well-balanced diet Genetic Screening discussed Integrated Screen: declined.  Ultrasound discussed; fetal survey: requested.  Return in about 4 weeks (around 06/26/2017) for LROB.  CRESENZO-DISHMAN,Marcedes Tech 05/29/2017

## 2017-05-30 LAB — OBSTETRIC PANEL, INCLUDING HIV
Antibody Screen: NEGATIVE
BASOS: 0 %
Basophils Absolute: 0 10*3/uL (ref 0.0–0.2)
EOS (ABSOLUTE): 0 10*3/uL (ref 0.0–0.4)
Eos: 1 %
HEMATOCRIT: 39 % (ref 34.0–46.6)
HIV SCREEN 4TH GENERATION: NONREACTIVE
Hemoglobin: 13.7 g/dL (ref 11.1–15.9)
Hepatitis B Surface Ag: NEGATIVE
Immature Grans (Abs): 0 10*3/uL (ref 0.0–0.1)
Immature Granulocytes: 0 %
Lymphocytes Absolute: 2.1 10*3/uL (ref 0.7–3.1)
Lymphs: 24 %
MCH: 31.1 pg (ref 26.6–33.0)
MCHC: 35.1 g/dL (ref 31.5–35.7)
MCV: 88 fL (ref 79–97)
MONOCYTES: 6 %
Monocytes Absolute: 0.5 10*3/uL (ref 0.1–0.9)
NEUTROS ABS: 6.2 10*3/uL (ref 1.4–7.0)
Neutrophils: 69 %
Platelets: 262 10*3/uL (ref 150–379)
RBC: 4.41 x10E6/uL (ref 3.77–5.28)
RDW: 13.5 % (ref 12.3–15.4)
RPR Ser Ql: NONREACTIVE
RUBELLA: 1.35 {index} (ref >0.99)
Rh Factor: POSITIVE
WBC: 8.8 10*3/uL (ref 3.4–10.8)

## 2017-05-30 LAB — URINALYSIS, ROUTINE W REFLEX MICROSCOPIC
Bilirubin, UA: NEGATIVE
Glucose, UA: NEGATIVE
Ketones, UA: NEGATIVE
LEUKOCYTES UA: NEGATIVE
Nitrite, UA: POSITIVE — AB
PH UA: 5.5 (ref 5.0–7.5)
Protein, UA: NEGATIVE
RBC, UA: NEGATIVE
SPEC GRAV UA: 1.028 (ref 1.005–1.030)
Urobilinogen, Ur: 0.2 mg/dL (ref 0.2–1.0)

## 2017-05-30 LAB — PMP SCREEN PROFILE (10S), URINE
Amphetamine Scrn, Ur: NEGATIVE ng/mL
BARBITURATE SCREEN URINE: NEGATIVE ng/mL
BENZODIAZEPINE SCREEN, URINE: NEGATIVE ng/mL
CANNABINOIDS UR QL SCN: NEGATIVE ng/mL
COCAINE(METAB.)SCREEN, URINE: NEGATIVE ng/mL
CREATININE(CRT), U: 154.1 mg/dL (ref 20.0–300.0)
METHADONE SCREEN, URINE: NEGATIVE ng/mL
OPIATE SCREEN URINE: NEGATIVE ng/mL
OXYCODONE+OXYMORPHONE UR QL SCN: NEGATIVE ng/mL
PH UR, DRUG SCRN: 5.8 (ref 4.5–8.9)
PHENCYCLIDINE QUANTITATIVE URINE: NEGATIVE ng/mL
Propoxyphene Scrn, Ur: NEGATIVE ng/mL

## 2017-05-30 LAB — MICROSCOPIC EXAMINATION: Casts: NONE SEEN /lpf

## 2017-05-30 LAB — MED LIST OPTION NOT SELECTED

## 2017-06-01 LAB — GC/CHLAMYDIA PROBE AMP
CHLAMYDIA, DNA PROBE: NEGATIVE
NEISSERIA GONORRHOEAE BY PCR: NEGATIVE

## 2017-06-03 LAB — URINE CULTURE

## 2017-06-26 ENCOUNTER — Encounter: Payer: Self-pay | Admitting: Advanced Practice Midwife

## 2017-06-26 ENCOUNTER — Ambulatory Visit (INDEPENDENT_AMBULATORY_CARE_PROVIDER_SITE_OTHER): Payer: Medicaid Other | Admitting: Advanced Practice Midwife

## 2017-06-26 VITALS — BP 104/70 | HR 99 | Wt 135.0 lb

## 2017-06-26 DIAGNOSIS — Z1389 Encounter for screening for other disorder: Secondary | ICD-10-CM

## 2017-06-26 DIAGNOSIS — Z363 Encounter for antenatal screening for malformations: Secondary | ICD-10-CM

## 2017-06-26 DIAGNOSIS — Z3A15 15 weeks gestation of pregnancy: Secondary | ICD-10-CM

## 2017-06-26 DIAGNOSIS — Z3402 Encounter for supervision of normal first pregnancy, second trimester: Secondary | ICD-10-CM

## 2017-06-26 DIAGNOSIS — Z331 Pregnant state, incidental: Secondary | ICD-10-CM

## 2017-06-26 LAB — POCT URINALYSIS DIPSTICK
Blood, UA: NEGATIVE
Glucose, UA: NEGATIVE
Ketones, UA: NEGATIVE
LEUKOCYTES UA: NEGATIVE
NITRITE UA: NEGATIVE
PROTEIN UA: NEGATIVE

## 2017-06-26 NOTE — Patient Instructions (Signed)
Vicki Maxwell, I greatly value your feedback.  If you receive a survey following your visit with us today, we appreciate you taking the time to fill it out.  Thanks, Cathie BeamsFran Cresenzo-Dishmon, CNM     Second Trimester of Pregnancy The second trimester is from week 14 through week 27 (months 4 through 6). The second trimester is often a time when you feel your best. Your body has adjusted to being pregnant, and you begin to feel better physically. Usually, morning sickness has lessened or quit completely, you may have more energy, and you may have an increase in appetite. The second trimester is also a time when the fetus is growing rapidly. At the end of the sixth month, the fetus is about 9 inches long and weighs about 1 pounds. You will likely begin to feel the baby move (quickening) between 16 and 20 weeks of pregnancy. Body changes during your second trimester Your body continues to go through many changes during your second trimester. The changes vary from woman to woman.  Your weight will continue to increase. You will notice your lower abdomen bulging out.  You may begin to get stretch marks on your hips, abdomen, and breasts.  You may develop headaches that can be relieved by medicines. The medicines should be approved by your health care provider.  You may urinate more often because the fetus is pressing on your bladder.  You may develop or continue to have heartburn as a result of your pregnancy.  You may develop constipation because certain hormones are causing the muscles that push waste through your intestines to slow down.  You may develop hemorrhoids or swollen, bulging veins (varicose veins).  You may have back pain. This is caused by: ? Weight gain. ? Pregnancy hormones that are relaxing the joints in your pelvis. ? A shift in weight and the muscles that support your balance.  Your breasts will continue to grow and they will continue to become tender.  Your gums may  bleed and may be sensitive to brushing and flossing.  Dark spots or blotches (chloasma, mask of pregnancy) may develop on your face. This will likely fade after the baby is born.  A dark line from your belly button to the pubic area (linea nigra) may appear. This will likely fade after the baby is born.  You may have changes in your hair. These can include thickening of your hair, rapid growth, and changes in texture. Some women also have hair loss during or after pregnancy, or hair that feels dry or thin. Your hair will most likely return to normal after your baby is born.  What to expect at prenatal visits During a routine prenatal visit:  You will be weighed to make sure you and the fetus are growing normally.  Your blood pressure will be taken.  Your abdomen will be measured to track your baby's growth.  The fetal heartbeat will be listened to.  Any test results from the previous visit will be discussed.  Your health care provider may ask you:  How you are feeling.  If you are feeling the baby move.  If you have had any abnormal symptoms, such as leaking fluid, bleeding, severe headaches, or abdominal cramping.  If you are using any tobacco products, including cigarettes, chewing tobacco, and electronic cigarettes.  If you have any questions.  Other tests that may be performed during your second trimester include:  Blood tests that check for: ? Low iron levels (anemia). ? High  blood sugar that affects pregnant women (gestational diabetes) between 42 and 28 weeks. ? Rh antibodies. This is to check for a protein on red blood cells (Rh factor).  Urine tests to check for infections, diabetes, or protein in the urine.  An ultrasound to confirm the proper growth and development of the baby.  An amniocentesis to check for possible genetic problems.  Fetal screens for spina bifida and Down syndrome.  HIV (human immunodeficiency virus) testing. Routine prenatal testing  includes screening for HIV, unless you choose not to have this test.  Follow these instructions at home: Medicines  Follow your health care provider's instructions regarding medicine use. Specific medicines may be either safe or unsafe to take during pregnancy.  Take a prenatal vitamin that contains at least 600 micrograms (mcg) of folic acid.  If you develop constipation, try taking a stool softener if your health care provider approves. Eating and drinking  Eat a balanced diet that includes fresh fruits and vegetables, whole grains, good sources of protein such as meat, eggs, or tofu, and low-fat dairy. Your health care provider will help you determine the amount of weight gain that is right for you.  Avoid raw meat and uncooked cheese. These carry germs that can cause birth defects in the baby.  If you have low calcium intake from food, talk to your health care provider about whether you should take a daily calcium supplement.  Limit foods that are high in fat and processed sugars, such as fried and sweet foods.  To prevent constipation: ? Drink enough fluid to keep your urine clear or pale yellow. ? Eat foods that are high in fiber, such as fresh fruits and vegetables, whole grains, and beans. Activity  Exercise only as directed by your health care provider. Most women can continue their usual exercise routine during pregnancy. Try to exercise for 30 minutes at least 5 days a week. Stop exercising if you experience uterine contractions.  Avoid heavy lifting, wear low heel shoes, and practice good posture.  A sexual relationship may be continued unless your health care provider directs you otherwise. Relieving pain and discomfort  Wear a good support bra to prevent discomfort from breast tenderness.  Take warm sitz baths to soothe any pain or discomfort caused by hemorrhoids. Use hemorrhoid cream if your health care provider approves.  Rest with your legs elevated if you have  leg cramps or low back pain.  If you develop varicose veins, wear support hose. Elevate your feet for 15 minutes, 3-4 times a day. Limit salt in your diet. Prenatal Care  Write down your questions. Take them to your prenatal visits.  Keep all your prenatal visits as told by your health care provider. This is important. Safety  Wear your seat belt at all times when driving.  Make a list of emergency phone numbers, including numbers for family, friends, the hospital, and police and fire departments. General instructions  Ask your health care provider for a referral to a local prenatal education class. Begin classes no later than the beginning of month 6 of your pregnancy.  Ask for help if you have counseling or nutritional needs during pregnancy. Your health care provider can offer advice or refer you to specialists for help with various needs.  Do not use hot tubs, steam rooms, or saunas.  Do not douche or use tampons or scented sanitary pads.  Do not cross your legs for long periods of time.  Avoid cat litter boxes and soil  used by cats. These carry germs that can cause birth defects in the baby and possibly loss of the fetus by miscarriage or stillbirth.  Avoid all smoking, herbs, alcohol, and unprescribed drugs. Chemicals in these products can affect the formation and growth of the baby.  Do not use any products that contain nicotine or tobacco, such as cigarettes and e-cigarettes. If you need help quitting, ask your health care provider.  Visit your dentist if you have not gone yet during your pregnancy. Use a soft toothbrush to brush your teeth and be gentle when you floss. Contact a health care provider if:  You have dizziness.  You have mild pelvic cramps, pelvic pressure, or nagging pain in the abdominal area.  You have persistent nausea, vomiting, or diarrhea.  You have a bad smelling vaginal discharge.  You have pain when you urinate. Get help right away if:  You  have a fever.  You are leaking fluid from your vagina.  You have spotting or bleeding from your vagina.  You have severe abdominal cramping or pain.  You have rapid weight gain or weight loss.  You have shortness of breath with chest pain.  You notice sudden or extreme swelling of your face, hands, ankles, feet, or legs.  You have not felt your baby move in over an hour.  You have severe headaches that do not go away when you take medicine.  You have vision changes. Summary  The second trimester is from week 14 through week 27 (months 4 through 6). It is also a time when the fetus is growing rapidly.  Your body goes through many changes during pregnancy. The changes vary from woman to woman.  Avoid all smoking, herbs, alcohol, and unprescribed drugs. These chemicals affect the formation and growth your baby.  Do not use any tobacco products, such as cigarettes, chewing tobacco, and e-cigarettes. If you need help quitting, ask your health care provider.  Contact your health care provider if you have any questions. Keep all prenatal visits as told by your health care provider. This is important. This information is not intended to replace advice given to you by your health care provider. Make sure you discuss any questions you have with your health care provider.      CHILDBIRTH CLASSES [phone] is the phone number for Pregnancy Classes or hospital tours at [facility] will be referred to  [email or web address] for more information on childbirth classes  At this site you may register for classes. You may sign up for a waiting list if classes are full. Please SIGN UP FOR THIS!.   When the waiting list becomes long, sometimes new classes can be added.

## 2017-06-26 NOTE — Progress Notes (Signed)
G1P0 243w5d Estimated Date of Delivery: 12/13/17  Blood pressure 104/70, pulse 99, weight 135 lb (61.2 kg), last menstrual period 03/08/2017.   BP weight and urine results all reviewed and noted.  Please refer to the obstetrical flow sheet for the fundal height and fetal heart rate documentation:  Patient denies any bleeding and no rupture of membranes symptoms or regular contractions. Patient is without complaints. Still working out. +- All questions were answered.  Orders Placed This Encounter  Procedures  . POCT urinalysis dipstick    Plan:  Continued routine obstetrical care,   Return in about 3 weeks (around 07/17/2017) for LROB, ZO:[id number]:Anatomy.

## 2017-07-17 ENCOUNTER — Other Ambulatory Visit (HOSPITAL_COMMUNITY): Payer: Self-pay | Admitting: Advanced Practice Midwife

## 2017-07-17 DIAGNOSIS — Z363 Encounter for antenatal screening for malformations: Secondary | ICD-10-CM

## 2017-07-18 ENCOUNTER — Encounter: Payer: Self-pay | Admitting: Women's Health

## 2017-07-18 ENCOUNTER — Ambulatory Visit (INDEPENDENT_AMBULATORY_CARE_PROVIDER_SITE_OTHER): Payer: Medicaid Other | Admitting: Women's Health

## 2017-07-18 ENCOUNTER — Ambulatory Visit (INDEPENDENT_AMBULATORY_CARE_PROVIDER_SITE_OTHER): Payer: Medicaid Other

## 2017-07-18 VITALS — BP 100/64 | HR 90 | Wt 138.5 lb

## 2017-07-18 DIAGNOSIS — R82998 Other abnormal findings in urine: Secondary | ICD-10-CM

## 2017-07-18 DIAGNOSIS — Z3402 Encounter for supervision of normal first pregnancy, second trimester: Secondary | ICD-10-CM

## 2017-07-18 DIAGNOSIS — O2341 Unspecified infection of urinary tract in pregnancy, first trimester: Secondary | ICD-10-CM

## 2017-07-18 DIAGNOSIS — Z3A18 18 weeks gestation of pregnancy: Secondary | ICD-10-CM

## 2017-07-18 DIAGNOSIS — Z363 Encounter for antenatal screening for malformations: Secondary | ICD-10-CM | POA: Diagnosis not present

## 2017-07-18 DIAGNOSIS — O2342 Unspecified infection of urinary tract in pregnancy, second trimester: Secondary | ICD-10-CM

## 2017-07-18 DIAGNOSIS — Z1389 Encounter for screening for other disorder: Secondary | ICD-10-CM

## 2017-07-18 DIAGNOSIS — Z331 Pregnant state, incidental: Secondary | ICD-10-CM

## 2017-07-18 LAB — POCT URINALYSIS DIPSTICK
GLUCOSE UA: NEGATIVE
KETONES UA: NEGATIVE
Nitrite, UA: NEGATIVE
Protein, UA: NEGATIVE
RBC UA: NEGATIVE

## 2017-07-18 NOTE — Progress Notes (Signed)
US 18+6 wks,breech,anterior pl gr 0,cx 3.6 cm,normal ovaries bilat,svp of fluid 5.3 cm,fhr 137 bpm,efw 263 g,anatomy complete,no obvious abnormalities

## 2017-07-18 NOTE — Patient Instructions (Signed)
Maeola HarmanLorena Rising, I greatly value your feedback.  If you receive a survey following your visit with us today, we appreciate you taking the time to fill it out.  Thanks, Joellyn HaffKim Keyundra Fant, CNM, WHNP-BC   Second Trimester of Pregnancy The second trimester is from week 14 through week 27 (months 4 through 6). The second trimester is often a time when you feel your best. Your body has adjusted to being pregnant, and you begin to feel better physically. Usually, morning sickness has lessened or quit completely, you may have more energy, and you may have an increase in appetite. The second trimester is also a time when the fetus is growing rapidly. At the end of the sixth month, the fetus is about 9 inches long and weighs about 1 pounds. You will likely begin to feel the baby move (quickening) between 16 and 20 weeks of pregnancy. Body changes during your second trimester Your body continues to go through many changes during your second trimester. The changes vary from woman to woman.  Your weight will continue to increase. You will notice your lower abdomen bulging out.  You may begin to get stretch marks on your hips, abdomen, and breasts.  You may develop headaches that can be relieved by medicines. The medicines should be approved by your health care provider.  You may urinate more often because the fetus is pressing on your bladder.  You may develop or continue to have heartburn as a result of your pregnancy.  You may develop constipation because certain hormones are causing the muscles that push waste through your intestines to slow down.  You may develop hemorrhoids or swollen, bulging veins (varicose veins).  You may have back pain. This is caused by: ? Weight gain. ? Pregnancy hormones that are relaxing the joints in your pelvis. ? A shift in weight and the muscles that support your balance.  Your breasts will continue to grow and they will continue to become tender.  Your gums may bleed  and may be sensitive to brushing and flossing.  Dark spots or blotches (chloasma, mask of pregnancy) may develop on your face. This will likely fade after the baby is born.  A dark line from your belly button to the pubic area (linea nigra) may appear. This will likely fade after the baby is born.  You may have changes in your hair. These can include thickening of your hair, rapid growth, and changes in texture. Some women also have hair loss during or after pregnancy, or hair that feels dry or thin. Your hair will most likely return to normal after your baby is born.  What to expect at prenatal visits During a routine prenatal visit:  You will be weighed to make sure you and the fetus are growing normally.  Your blood pressure will be taken.  Your abdomen will be measured to track your baby's growth.  The fetal heartbeat will be listened to.  Any test results from the previous visit will be discussed.  Your health care provider may ask you:  How you are feeling.  If you are feeling the baby move.  If you have had any abnormal symptoms, such as leaking fluid, bleeding, severe headaches, or abdominal cramping.  If you are using any tobacco products, including cigarettes, chewing tobacco, and electronic cigarettes.  If you have any questions.  Other tests that may be performed during your second trimester include:  Blood tests that check for: ? Low iron levels (anemia). ? High blood  sugar that affects pregnant women (gestational diabetes) between 55 and 28 weeks. ? Rh antibodies. This is to check for a protein on red blood cells (Rh factor).  Urine tests to check for infections, diabetes, or protein in the urine.  An ultrasound to confirm the proper growth and development of the baby.  An amniocentesis to check for possible genetic problems.  Fetal screens for spina bifida and Down syndrome.  HIV (human immunodeficiency virus) testing. Routine prenatal testing includes  screening for HIV, unless you choose not to have this test.  Follow these instructions at home: Medicines  Follow your health care provider's instructions regarding medicine use. Specific medicines may be either safe or unsafe to take during pregnancy.  Take a prenatal vitamin that contains at least 600 micrograms (mcg) of folic acid.  If you develop constipation, try taking a stool softener if your health care provider approves. Eating and drinking  Eat a balanced diet that includes fresh fruits and vegetables, whole grains, good sources of protein such as meat, eggs, or tofu, and low-fat dairy. Your health care provider will help you determine the amount of weight gain that is right for you.  Avoid raw meat and uncooked cheese. These carry germs that can cause birth defects in the baby.  If you have low calcium intake from food, talk to your health care provider about whether you should take a daily calcium supplement.  Limit foods that are high in fat and processed sugars, such as fried and sweet foods.  To prevent constipation: ? Drink enough fluid to keep your urine clear or pale yellow. ? Eat foods that are high in fiber, such as fresh fruits and vegetables, whole grains, and beans. Activity  Exercise only as directed by your health care provider. Most women can continue their usual exercise routine during pregnancy. Try to exercise for 30 minutes at least 5 days a week. Stop exercising if you experience uterine contractions.  Avoid heavy lifting, wear low heel shoes, and practice good posture.  A sexual relationship may be continued unless your health care provider directs you otherwise. Relieving pain and discomfort  Wear a good support bra to prevent discomfort from breast tenderness.  Take warm sitz baths to soothe any pain or discomfort caused by hemorrhoids. Use hemorrhoid cream if your health care provider approves.  Rest with your legs elevated if you have leg cramps  or low back pain.  If you develop varicose veins, wear support hose. Elevate your feet for 15 minutes, 3-4 times a day. Limit salt in your diet. Prenatal Care  Write down your questions. Take them to your prenatal visits.  Keep all your prenatal visits as told by your health care provider. This is important. Safety  Wear your seat belt at all times when driving.  Make a list of emergency phone numbers, including numbers for family, friends, the hospital, and police and fire departments. General instructions  Ask your health care provider for a referral to a local prenatal education class. Begin classes no later than the beginning of month 6 of your pregnancy.  Ask for help if you have counseling or nutritional needs during pregnancy. Your health care provider can offer advice or refer you to specialists for help with various needs.  Do not use hot tubs, steam rooms, or saunas.  Do not douche or use tampons or scented sanitary pads.  Do not cross your legs for long periods of time.  Avoid cat litter boxes and soil used  by cats. These carry germs that can cause birth defects in the baby and possibly loss of the fetus by miscarriage or stillbirth.  Avoid all smoking, herbs, alcohol, and unprescribed drugs. Chemicals in these products can affect the formation and growth of the baby.  Do not use any products that contain nicotine or tobacco, such as cigarettes and e-cigarettes. If you need help quitting, ask your health care provider.  Visit your dentist if you have not gone yet during your pregnancy. Use a soft toothbrush to brush your teeth and be gentle when you floss. Contact a health care provider if:  You have dizziness.  You have mild pelvic cramps, pelvic pressure, or nagging pain in the abdominal area.  You have persistent nausea, vomiting, or diarrhea.  You have a bad smelling vaginal discharge.  You have pain when you urinate. Get help right away if:  You have a  fever.  You are leaking fluid from your vagina.  You have spotting or bleeding from your vagina.  You have severe abdominal cramping or pain.  You have rapid weight gain or weight loss.  You have shortness of breath with chest pain.  You notice sudden or extreme swelling of your face, hands, ankles, feet, or legs.  You have not felt your baby move in over an hour.  You have severe headaches that do not go away when you take medicine.  You have vision changes. Summary  The second trimester is from week 14 through week 27 (months 4 through 6). It is also a time when the fetus is growing rapidly.  Your body goes through many changes during pregnancy. The changes vary from woman to woman.  Avoid all smoking, herbs, alcohol, and unprescribed drugs. These chemicals affect the formation and growth your baby.  Do not use any tobacco products, such as cigarettes, chewing tobacco, and e-cigarettes. If you need help quitting, ask your health care provider.  Contact your health care provider if you have any questions. Keep all prenatal visits as told by your health care provider. This is important. This information is not intended to replace advice given to you by your health care provider. Make sure you discuss any questions you have with your health care provider. Document Released: 04/10/2001 Document Revised: 09/22/2015 Document Reviewed: 06/17/2012 Elsevier Interactive Patient Education  2017 [employer].

## 2017-07-18 NOTE — Progress Notes (Signed)
   LOW-RISK PREGNANCY VISIT Patient name: Vicki Maxwell Krach MRN [id number]  Date of birth: 1995/06/08 Chief Complaint:   Routine Prenatal Visit  History of Present Illness:   Vicki Maxwell Rio is a 22 y.o. G1P0 female at 2873w6d with an Estimated Date of Delivery: 12/13/17 being seen today for ongoing management of a low-risk pregnancy.  Today she reports no complaints. Contractions: Not present. Vag. Bleeding: None.  Movement: Absent. denies leaking of fluid. Review of Systems:   Pertinent items are noted in HPI Denies abnormal vaginal discharge w/ itching/odor/irritation, headaches, visual changes, shortness of breath, chest pain, abdominal pain, severe nausea/vomiting, or problems with urination or bowel movements unless otherwise stated above. Pertinent History Reviewed:  Reviewed past medical,surgical, social, obstetrical and family history.  Reviewed problem list, medications and allergies. Physical Assessment:   Vitals:   07/18/17 1449  BP: 100/64  Pulse: 90  Weight: 138 lb 8 oz (62.8 kg)  Body mass index is 26.17 kg/m.        Physical Examination:   General appearance: Well appearing, and in no distress  Mental status: Alert, oriented to person, place, and time  Skin: Warm & dry  Cardiovascular: Normal heart rate noted  Respiratory: Normal respiratory effort, no distress  Abdomen: Soft, gravid, nontender  Pelvic: Cervical exam deferred         Extremities: Edema: None  Fetal Status: Fetal Heart Rate (bpm): 137 u/s   Movement: Absent    US 18+6 wks,breech,anterior pl gr 0,cx 3.6 cm,normal ovaries bilat,svp of fluid 5.3 cm,fhr 137 bpm,efw 263 g,anatomy complete,no obvious abnormalities    Results for orders placed or performed in visit on 07/18/17 (from the past 24 hour(s))  POCT Urinalysis Dipstick   Collection Time: 07/18/17  2:51 PM  Result Value Ref Range   Color, UA     Clarity, UA     Glucose, UA neg    Bilirubin, UA     Ketones, UA neg    Spec Grav, UA  1.010 -  1.025   Blood, UA neg    pH, UA  5.0 - 8.0   Protein, UA neg    Urobilinogen, UA  0.2 or 1.0 E.U./dL   Nitrite, UA neg    Leukocytes, UA Trace (A) Negative   Appearance     Odor      Assessment & Plan:  1) Low-risk pregnancy G1P0 at 3473w6d with an Estimated Date of Delivery: 12/13/17   2) +Urine cx earlier in pregnancy> will send another cx today   Meds: No orders of the defined types were placed in this encounter.  Labs/procedures today: anatomy u/s  Plan:  Continue routine obstetrical care   Reviewed: Preterm labor symptoms and general obstetric precautions including but not limited to vaginal bleeding, contractions, leaking of fluid and fetal movement were reviewed in detail with the patient.  All questions were answered  Follow-up: Return in about 1 month (around 08/15/2017) for LROB.  Orders Placed This Encounter  Procedures  . Urine Culture  . POCT Urinalysis Dipstick   Cheral MarkerKimberly R Briget Shaheed CNM, [facility] SalemWHNP-BC 07/18/2017 3:20 PM

## 2017-07-20 LAB — URINE CULTURE: Organism ID, Bacteria: NO GROWTH

## 2017-08-15 ENCOUNTER — Ambulatory Visit (INDEPENDENT_AMBULATORY_CARE_PROVIDER_SITE_OTHER): Payer: Medicaid Other | Admitting: Advanced Practice Midwife

## 2017-08-15 ENCOUNTER — Encounter: Payer: Self-pay | Admitting: Advanced Practice Midwife

## 2017-08-15 VITALS — BP 100/60 | HR 59 | Wt 146.0 lb

## 2017-08-15 DIAGNOSIS — Z1389 Encounter for screening for other disorder: Secondary | ICD-10-CM

## 2017-08-15 DIAGNOSIS — Z3402 Encounter for supervision of normal first pregnancy, second trimester: Secondary | ICD-10-CM

## 2017-08-15 DIAGNOSIS — Z331 Pregnant state, incidental: Secondary | ICD-10-CM

## 2017-08-15 DIAGNOSIS — Z3A22 22 weeks gestation of pregnancy: Secondary | ICD-10-CM

## 2017-08-15 LAB — POCT URINALYSIS DIPSTICK
Blood, UA: NEGATIVE
Glucose, UA: NEGATIVE
KETONES UA: NEGATIVE
LEUKOCYTES UA: NEGATIVE
NITRITE UA: NEGATIVE
PROTEIN UA: NEGATIVE

## 2017-08-15 NOTE — Progress Notes (Signed)
  G1P0 6447w6d Estimated Date of Delivery: 12/13/17  Blood pressure 100/60, pulse (!) 59, weight 146 lb (66.2 kg), last menstrual period 03/08/2017.   BP weight and urine results all reviewed and noted.  Please refer to the obstetrical flow sheet for the fundal height and fetal heart rate documentation:  Patient reports good fetal movement, denies any bleeding and no rupture of membranes symptoms or regular contractions. Patient is without complaints. All questions were answered.   Physical Assessment:   Vitals:   08/15/17 1501  BP: 100/60  Pulse: (!) 59  Weight: 146 lb (66.2 kg)  Body mass index is 27.59 kg/m.        Physical Examination:   General appearance: Well appearing, and in no distress  Mental status: Alert, oriented to person, place, and time  Skin: Warm & dry  Cardiovascular: Normal heart rate noted  Respiratory: Normal respiratory effort, no distress  Abdomen: Soft, gravid, nontender  Pelvic: Cervical exam deferred         Extremities: Edema: None  Fetal Status:     Movement: Present    Results for orders placed or performed in visit on 08/15/17 (from the past 24 hour(s))  POCT urinalysis dipstick   Collection Time: 08/15/17  3:02 PM  Result Value Ref Range   Color, UA     Clarity, UA     Glucose, UA neg    Bilirubin, UA     Ketones, UA neg    Spec Grav, UA  1.010 - 1.025   Blood, UA neg    pH, UA  5.0 - 8.0   Protein, UA neg    Urobilinogen, UA  0.2 or 1.0 E.U./dL   Nitrite, UA neg    Leukocytes, UA Negative Negative   Appearance     Odor       Orders Placed This Encounter  Procedures  . POCT urinalysis dipstick    Plan:  Continued routine obstetrical care,   Return in about 1 month (around 09/12/2017) for PN2/LROB.

## 2017-09-13 ENCOUNTER — Encounter: Payer: Self-pay | Admitting: Women's Health

## 2017-09-13 ENCOUNTER — Ambulatory Visit (INDEPENDENT_AMBULATORY_CARE_PROVIDER_SITE_OTHER): Payer: Medicaid Other | Admitting: Women's Health

## 2017-09-13 ENCOUNTER — Other Ambulatory Visit: Payer: Medicaid Other

## 2017-09-13 VITALS — BP 120/80 | HR 100 | Wt 152.0 lb

## 2017-09-13 DIAGNOSIS — Z3402 Encounter for supervision of normal first pregnancy, second trimester: Secondary | ICD-10-CM

## 2017-09-13 DIAGNOSIS — Z3A27 27 weeks gestation of pregnancy: Secondary | ICD-10-CM

## 2017-09-13 DIAGNOSIS — Z23 Encounter for immunization: Secondary | ICD-10-CM | POA: Diagnosis not present

## 2017-09-13 DIAGNOSIS — Z331 Pregnant state, incidental: Secondary | ICD-10-CM

## 2017-09-13 DIAGNOSIS — Z1389 Encounter for screening for other disorder: Secondary | ICD-10-CM

## 2017-09-13 DIAGNOSIS — Z131 Encounter for screening for diabetes mellitus: Secondary | ICD-10-CM

## 2017-09-13 LAB — POCT URINALYSIS DIPSTICK
GLUCOSE UA: NEGATIVE
KETONES UA: NEGATIVE
Leukocytes, UA: NEGATIVE
Nitrite, UA: NEGATIVE
Protein, UA: NEGATIVE
RBC UA: NEGATIVE

## 2017-09-13 NOTE — Patient Instructions (Signed)
Vicki Maxwell, I greatly value your feedback.  If you receive a survey following your visit with [place] today, we appreciate you taking the time to fill it out.  Thanks, Knute Neu, CNM, WHNP-BC   Call the office [phone] or go to [facility] if:  You begin to have strong, frequent contractions  Your water breaks.  Sometimes it is a big gush of fluid, sometimes it is just a trickle that keeps getting your panties wet or running down your legs  You have vaginal bleeding.  It is normal to have a small amount of spotting if your cervix was checked.   You don't feel your baby moving like normal.  If you don't, get you something to eat and drink and lay down and focus on feeling your baby move.  You should feel at least 10 movements in 2 hours.  If you don't, you should call the office or go to [facility].    Tdap Vaccine  It is recommended that you get the Tdap vaccine during the third trimester of EACH pregnancy to help protect your baby from getting pertussis (whooping cough)  27-36 weeks is the BEST time to do this so that you can pass the protection on to your baby. During pregnancy is better than after pregnancy, but if you are unable to get it during pregnancy it will be offered at the hospital.   You can get this vaccine at the health department or your family doctor  Everyone who will be around your baby should also be up-to-date on their vaccines. Adults (who are not pregnant) only need 1 dose of Tdap during adulthood.   Third Trimester of Pregnancy The third trimester is from week 29 through week 42, months 7 through 9. The third trimester is a time when the fetus is growing rapidly. At the end of the ninth month, the fetus is about 20 inches in length and weighs 6-10 pounds.  BODY CHANGES Your body goes through many changes during pregnancy. The changes vary from woman to woman.   Your weight will continue to increase. You can expect to gain 25-35 pounds (11-16 kg) by  the end of the pregnancy.  You may begin to get stretch marks on your hips, abdomen, and breasts.  You may urinate more often because the fetus is moving lower into your pelvis and pressing on your bladder.  You may develop or continue to have heartburn as a result of your pregnancy.  You may develop constipation because certain hormones are causing the muscles that push waste through your intestines to slow down.  You may develop hemorrhoids or swollen, bulging veins (varicose veins).  You may have pelvic pain because of the weight gain and pregnancy hormones relaxing your joints between the bones in your pelvis. Backaches may result from overexertion of the muscles supporting your posture.  You may have changes in your hair. These can include thickening of your hair, rapid growth, and changes in texture. Some women also have hair loss during or after pregnancy, or hair that feels dry or thin. Your hair will most likely return to normal after your baby is born.  Your breasts will continue to grow and be tender. A yellow discharge may leak from your breasts called colostrum.  Your belly button may stick out.  You may feel short of breath because of your expanding uterus.  You may notice the fetus "dropping," or moving lower in your abdomen.  You may have a bloody mucus  discharge. This usually occurs a few days to a week before labor begins.  Your cervix becomes thin and soft (effaced) near your due date. WHAT TO EXPECT AT YOUR PRENATAL EXAMS  You will have prenatal exams every 2 weeks until week 36. Then, you will have weekly prenatal exams. During a routine prenatal visit:  You will be weighed to make sure you and the fetus are growing normally.  Your blood pressure is taken.  Your abdomen will be measured to track your baby's growth.  The fetal heartbeat will be listened to.  Any test results from the previous visit will be discussed.  You may have a cervical check near your  due date to see if you have effaced. At around 36 weeks, your caregiver will check your cervix. At the same time, your caregiver will also perform a test on the secretions of the vaginal tissue. This test is to determine if a type of bacteria, Group B streptococcus, is present. Your caregiver will explain this further. Your caregiver may ask you:  What your birth plan is.  How you are feeling.  If you are feeling the baby move.  If you have had any abnormal symptoms, such as leaking fluid, bleeding, severe headaches, or abdominal cramping.  If you have any questions. Other tests or screenings that may be performed during your third trimester include:  Blood tests that check for low iron levels (anemia).  Fetal testing to check the health, activity level, and growth of the fetus. Testing is done if you have certain medical conditions or if there are problems during the pregnancy. FALSE LABOR You may feel small, irregular contractions that eventually go away. These are called Braxton Hicks contractions, or false labor. Contractions may last for hours, days, or even weeks before true labor sets in. If contractions come at regular intervals, intensify, or become painful, it is best to be seen by your caregiver.  SIGNS OF LABOR   Menstrual-like cramps.  Contractions that are 5 minutes apart or less.  Contractions that start on the top of the uterus and spread down to the lower abdomen and back.  A sense of increased pelvic pressure or back pain.  A watery or bloody mucus discharge that comes from the vagina. If you have any of these signs before the 37th week of pregnancy, call your caregiver right away. You need to go to the hospital to get checked immediately. HOME CARE INSTRUCTIONS   Avoid all smoking, herbs, alcohol, and unprescribed drugs. These chemicals affect the formation and growth of the baby.  Follow your caregiver's instructions regarding medicine use. There are medicines  that are either safe or unsafe to take during pregnancy.  Exercise only as directed by your caregiver. Experiencing uterine cramps is a good sign to stop exercising.  Continue to eat regular, healthy meals.  Wear a good support bra for breast tenderness.  Do not use hot tubs, steam rooms, or saunas.  Wear your seat belt at all times when driving.  Avoid raw meat, uncooked cheese, cat litter boxes, and soil used by cats. These carry germs that can cause birth defects in the baby.  Take your prenatal vitamins.  Try taking a stool softener (if your caregiver approves) if you develop constipation. Eat more high-fiber foods, such as fresh vegetables or fruit and whole grains. Drink plenty of fluids to keep your urine clear or pale yellow.  Take warm sitz baths to soothe any pain or discomfort caused by hemorrhoids. Use  hemorrhoid cream if your caregiver approves.  If you develop varicose veins, wear support hose. Elevate your feet for 15 minutes, 3-4 times a day. Limit salt in your diet.  Avoid heavy lifting, wear low heal shoes, and practice good posture.  Rest a lot with your legs elevated if you have leg cramps or low back pain.  Visit your dentist if you have not gone during your pregnancy. Use a soft toothbrush to brush your teeth and be gentle when you floss.  A sexual relationship may be continued unless your caregiver directs you otherwise.  Do not travel far distances unless it is absolutely necessary and only with the approval of your caregiver.  Take prenatal classes to understand, practice, and ask questions about the labor and delivery.  Make a trial run to the hospital.  Pack your hospital bag.  Prepare the baby's nursery.  Continue to go to all your prenatal visits as directed by your caregiver. SEEK MEDICAL CARE IF:  You are unsure if you are in labor or if your water has broken.  You have dizziness.  You have mild pelvic cramps, pelvic pressure, or nagging  pain in your abdominal area.  You have persistent nausea, vomiting, or diarrhea.  You have a bad smelling vaginal discharge.  You have pain with urination. SEEK IMMEDIATE MEDICAL CARE IF:   You have a fever.  You are leaking fluid from your vagina.  You have spotting or bleeding from your vagina.  You have severe abdominal cramping or pain.  You have rapid weight loss or gain.  You have shortness of breath with chest pain.  You notice sudden or extreme swelling of your face, hands, ankles, feet, or legs.  You have not felt your baby move in over an hour.  You have severe headaches that do not go away with medicine.  You have vision changes. Document Released: 04/10/2001 Document Revised: 04/21/2013 Document Reviewed: 06/17/2012 [facility] Patient Information 2015 [place], [place]. This information is not intended to replace advice given to you by your health care provider. Make sure you discuss any questions you have with your health care provider.  Thinking About Doren Custard???  Why consider waterbirth? . Gentle birth for babies . Less pain medicine used in labor . May allow for passive descent/less pushing . May reduce perineal tears  . More mobility and instinctive maternal position changes . Increased maternal relaxation . Reduced blood pressure in labor  Is waterbirth safe? What are the risks of infection, drowning or other complications? . Infection o Very low risk (3.7 % for tub vs 4.8% for bed) o 7 in 8000 waterbirths with documented infection o Poorly cleaned equipment most common cause o Slightly lower group B strep transmission rate  . Drowning o Maternal:  - Very low risk   - Related to seizures or fainting o Newborn:  - Very low risk. No evidence of increased risk of respiratory problems in multiple large studies - Physiological protection from breathing under water - Avoid underwater birth if there are any fetal complications - Once baby's head is  out of the water, keep it out.  . Birth complication o Some reports of cord trauma, but risk decreased by bringing baby to surface gradually o No evidence of increased risk of shoulder dystocia. Mothers can usually change positions faster in water than in a bed, possibly aiding the maneuvers to free the shoulder.  You must attend a Doren Custard class at [facility]  3rd Wednesday of every month from  7-9pm  Free  Register by calling [phone] or online at [email or web address]  Bring [place] the certificate from the class  Waterbirth supplies needed for [facility] patients:  Our practice has a Heritage manager in a Box tub (Regular size) at the hospital that you can borrow  You will need to purchase an accessory kit that has all needed supplies through [place] [phone] for kit, $65 for liner=$179+tax) or online through [employer]  Or you can purchase the supplies separately: o Single-use disposable tub liner for [employer] in a Box (REGULAR size) o New garden hose labeled "lead-free", "suitable for drinking water", "non-toxic" OR "water potable" o Garden hose to remove the dirty water o Electric drain pump to remove water (We recommend 792 gallon per hour or greater pump.)  o Fish net o Bathing suit top (optional) o Long-handled mirror (optional)  [employer]- sells EVERYTHING waterbirth related, accessory kits, tubs, etc  The [employer] (www.thelaborladies.com) this is a great service if you don't want to be responsible for the set up/take down of tub! Just call the Labor Ladies and they will come do it for you for $200! This includes the rental fee for their tub, the accessory kit, set-up and take down   Things that would prevent you from having a waterbirth:  Premature, <37wks  Previous cesarean birth  Presence of thick meconium-stained fluid  Multiple gestation (Twins, triplets, etc.)  Uncontrolled diabetes  Hypertension  Heavy vaginal  bleeding  Non-reassuring fetal heart rate  Active infection (MRSA, etc.)  If your labor has to be induced  Other risk issues identified by your obstetrical provider

## 2017-09-13 NOTE — Progress Notes (Signed)
   LOW-RISK PREGNANCY VISIT Patient name: Vicki Maxwell MRN [id number]  Date of birth: 08-Dec-1995 Chief Complaint:   low risk ob (PN2)  History of Present Illness:   Vicki Maxwell is a 22 y.o. G1P0 female at [redacted]w[redacted]d with an Estimated Date of Delivery: 12/13/17 being seen today for ongoing management of a low-risk pregnancy.  Today she reports went to waterbirth class this month. Contractions: Not present.  .  Movement: Present. denies leaking of fluid. Review of Systems:   Pertinent items are noted in HPI Denies abnormal vaginal discharge w/ itching/odor/irritation, headaches, visual changes, shortness of breath, chest pain, abdominal pain, severe nausea/vomiting, or problems with urination or bowel movements unless otherwise stated above. Pertinent History Reviewed:  Reviewed past medical,surgical, social, obstetrical and family history.  Reviewed problem list, medications and allergies. Physical Assessment:   Vitals:   09/13/17 0944  BP: 120/80  Pulse: 100  Weight: 152 lb (68.9 kg)  Body mass index is 28.72 kg/m.        Physical Examination:   General appearance: Well appearing, and in no distress  Mental status: Alert, oriented to person, place, and time  Skin: Warm & dry  Cardiovascular: Normal heart rate noted  Respiratory: Normal respiratory effort, no distress  Abdomen: Soft, gravid, nontender  Pelvic: Cervical exam deferred         Extremities: Edema: None  Fetal Status: Fetal Heart Rate (bpm): 144 Fundal Height: 25 cm Movement: Present    Results for orders placed or performed in visit on 09/13/17 (from the past 24 hour(s))  POCT urinalysis dipstick   Collection Time: 09/13/17  9:51 AM  Result Value Ref Range   Color, UA     Clarity, UA     Glucose, UA neg    Bilirubin, UA     Ketones, UA neg    Spec Grav, UA  1.010 - 1.025   Blood, UA neg    pH, UA  5.0 - 8.0   Protein, UA neg    Urobilinogen, UA  0.2 or 1.0 E.U./dL   Nitrite, UA neg    Leukocytes, UA  Negative Negative   Appearance     Odor      Assessment & Plan:  1) Low-risk pregnancy G1P0 at [redacted]w[redacted]d with an Estimated Date of Delivery: 12/13/17   2) Interested in Systems developer, gave printed info, discussed, has already attended class   Meds: No orders of the defined types were placed in this encounter.  Labs/procedures today: pn2, tdap  Plan:  Continue routine obstetrical care   Reviewed: Preterm labor symptoms and general obstetric precautions including but not limited to vaginal bleeding, contractions, leaking of fluid and fetal movement were reviewed in detail with the patient.  Recommended Tdap at HD/PCP per CDC guidelines. All questions were answered  Follow-up: Return in about 1 month (around 10/11/2017) for LROB.  Orders Placed This Encounter  Procedures  . POCT urinalysis dipstick   Cheral Marker CNM, [facility] 09/13/2017 10:16 AM

## 2017-09-13 NOTE — Addendum Note (Signed)
Addended by: Federico Flake A on: 09/13/2017 10:28 AM   Modules accepted: Orders

## 2017-09-14 LAB — CBC
HEMATOCRIT: 38.1 % (ref 34.0–46.6)
HEMOGLOBIN: 12.9 g/dL (ref 11.1–15.9)
MCH: 31.1 pg (ref 26.6–33.0)
MCHC: 33.9 g/dL (ref 31.5–35.7)
MCV: 92 fL (ref 79–97)
Platelets: 224 10*3/uL (ref 150–379)
RBC: 4.15 x10E6/uL (ref 3.77–5.28)
RDW: 13.8 % (ref 12.3–15.4)
WBC: 8.4 10*3/uL (ref 3.4–10.8)

## 2017-09-14 LAB — GLUCOSE TOLERANCE, 2 HOURS W/ 1HR
GLUCOSE, 1 HOUR: 109 mg/dL (ref 65–179)
GLUCOSE, 2 HOUR: 84 mg/dL (ref 65–152)
GLUCOSE, FASTING: 77 mg/dL (ref 65–91)

## 2017-09-14 LAB — HIV ANTIBODY (ROUTINE TESTING W REFLEX): HIV Screen 4th Generation wRfx: NONREACTIVE

## 2017-09-14 LAB — ANTIBODY SCREEN: ANTIBODY SCREEN: NEGATIVE

## 2017-09-14 LAB — RPR: RPR: NONREACTIVE

## 2017-10-16 ENCOUNTER — Ambulatory Visit (INDEPENDENT_AMBULATORY_CARE_PROVIDER_SITE_OTHER): Payer: Medicaid Other | Admitting: Obstetrics and Gynecology

## 2017-10-16 ENCOUNTER — Encounter: Payer: Self-pay | Admitting: Obstetrics and Gynecology

## 2017-10-16 VITALS — BP 100/57 | HR 87 | Wt 157.0 lb

## 2017-10-16 DIAGNOSIS — Z331 Pregnant state, incidental: Secondary | ICD-10-CM

## 2017-10-16 DIAGNOSIS — Z3A31 31 weeks gestation of pregnancy: Secondary | ICD-10-CM

## 2017-10-16 DIAGNOSIS — Z3403 Encounter for supervision of normal first pregnancy, third trimester: Secondary | ICD-10-CM

## 2017-10-16 DIAGNOSIS — Z1389 Encounter for screening for other disorder: Secondary | ICD-10-CM

## 2017-10-16 LAB — POCT URINALYSIS DIPSTICK
GLUCOSE UA: NEGATIVE
KETONES UA: NEGATIVE
LEUKOCYTES UA: NEGATIVE
NITRITE UA: NEGATIVE
Protein, UA: NEGATIVE
RBC UA: NEGATIVE

## 2017-10-16 NOTE — Progress Notes (Signed)
Patient ID: Vicki Maxwell, female   DOB: [birth year], 22 y.o.   MRN: [id number]    LOW-RISK PREGNANCY VISIT Patient name: Vicki Maxwell MRN [id number]  Date of birth: [birth year] Chief Complaint:   low risk ob  History of Present Illness:   Vicki Maxwell is a 22 y.o. G1P0 female at 6936w5d with an Estimated Date of Delivery: 12/13/17 being seen today for ongoing management of a low-risk pregnancy.  Today she reports no complaints. Pt finished classes last week and is considering water birth. She plans on meeting with midwife and reports that the baby is staying active.   Contractions: Not present.  .  Movement: Present. denies leaking of fluid. Review of Systems:   Pertinent items are noted in HPI Denies abnormal vaginal discharge w/ itching/odor/irritation, headaches, visual changes, shortness of breath, chest pain, abdominal pain, severe nausea/vomiting, or problems with urination or bowel movements unless otherwise stated above. Pertinent History Reviewed:  Reviewed past medical,surgical, social, obstetrical and family history.  Reviewed problem list, medications and allergies. Physical Assessment:   Vitals:   10/16/17 1541  BP: (!) 100/57  Pulse: 87  Weight: 157 lb (71.2 kg)  Body mass index is 29.66 kg/m.        Physical Examination:   General appearance: Well appearing, and in no distress  Mental status: Alert, oriented to person, place, and time  Skin: Warm & dry  Cardiovascular: Normal heart rate noted  Respiratory: Normal respiratory effort, no distress  Abdomen: Soft, gravid, nontender  Pelvic: Cervical exam deferred         Extremities: Edema: None  Fetal Status: Fetal Heart Rate (bpm): 141 Fundal Height: 31 cm Movement: Present    Results for orders placed or performed in visit on 10/16/17 (from the past 24 hour(s))  POCT urinalysis dipstick   Collection Time: 10/16/17  3:50 PM  Result Value Ref Range   Color, UA     Clarity, UA     Glucose, UA Negative  Negative   Bilirubin, UA     Ketones, UA neg    Spec Grav, UA  1.010 - 1.025   Blood, UA neg    pH, UA  5.0 - 8.0   Protein, UA Negative Negative   Urobilinogen, UA  0.2 or 1.0 E.U./dL   Nitrite, UA neg    Leukocytes, UA Negative Negative   Appearance     Odor      Assessment & Plan:  1) Low-risk pregnancy G1P0 at 836w5d with an Estimated Date of Delivery: 12/13/17    Meds: No orders of the defined types were placed in this encounter.  Labs/procedures today:  Plan:  Continue routine obstetrical care  Repeat chlamydia and gonorrhea group B @ 36+ wk  Follow-up: Return in about 2 weeks (around 10/30/2017) for LROB.  Orders Placed This Encounter  Procedures  . POCT urinalysis dipstick   By signing my name below, I, Pietro CassisEmily Tufford, attest that this documentation has been prepared under the direction and in the presence of Tilda BurrowFerguson, Robt Okuda V, MD. Electronically Signed: Pietro CassisEmily Tufford, Medical Scribe. 10/16/17. 4:07 PM.   I personally performed the services described in this documentation, which was SCRIBED in my presence. The recorded information has been reviewed and considered accurate. It has been edited as necessary during review. Tilda BurrowJohn V Renee Erb, MD

## 2017-10-30 ENCOUNTER — Ambulatory Visit (INDEPENDENT_AMBULATORY_CARE_PROVIDER_SITE_OTHER): Payer: Medicaid Other | Admitting: Advanced Practice Midwife

## 2017-10-30 ENCOUNTER — Encounter: Payer: Self-pay | Admitting: Advanced Practice Midwife

## 2017-10-30 VITALS — BP 100/68 | HR 85 | Wt 160.0 lb

## 2017-10-30 DIAGNOSIS — Z331 Pregnant state, incidental: Secondary | ICD-10-CM

## 2017-10-30 DIAGNOSIS — Z3403 Encounter for supervision of normal first pregnancy, third trimester: Secondary | ICD-10-CM

## 2017-10-30 DIAGNOSIS — Z1389 Encounter for screening for other disorder: Secondary | ICD-10-CM

## 2017-10-30 DIAGNOSIS — Z3A33 33 weeks gestation of pregnancy: Secondary | ICD-10-CM

## 2017-10-30 LAB — POCT URINALYSIS DIPSTICK
Blood, UA: NEGATIVE
GLUCOSE UA: NEGATIVE
Ketones, UA: NEGATIVE
Nitrite, UA: NEGATIVE
Protein, UA: POSITIVE — AB

## 2017-10-30 NOTE — Progress Notes (Signed)
  G1P0 7626w5d Estimated Date of Delivery: 12/13/17  Blood pressure 100/68, pulse 85, weight 160 lb (72.6 kg), last menstrual period 03/08/2017.   BP weight and urine results all reviewed and noted.  Please refer to the obstetrical flow sheet for the fundal height and fetal heart rate documentation:  Patient reports good fetal movement, denies any bleeding and no rupture of membranes symptoms or regular contractions. Patient is without complaints. All questions were answered.   Physical Assessment:   Vitals:   10/30/17 1606  BP: 100/68  Pulse: 85  Weight: 160 lb (72.6 kg)  Body mass index is 30.23 kg/m.        Physical Examination:   General appearance: Well appearing, and in no distress  Mental status: Alert, oriented to person, place, and time  Skin: Warm & dry  Cardiovascular: Normal heart rate noted  Respiratory: Normal respiratory effort, no distress  Abdomen: Soft, gravid, nontender  Pelvic: Cervical exam deferred         Extremities: Edema: None  Fetal Status:     Movement: Present    Results for orders placed or performed in visit on 10/30/17 (from the past 24 hour(s))  POCT urinalysis dipstick   Collection Time: 10/30/17  4:07 PM  Result Value Ref Range   Color, UA     Clarity, UA     Glucose, UA Negative Negative   Bilirubin, UA     Ketones, UA neg    Spec Grav, UA  1.010 - 1.025   Blood, UA neg    pH, UA  5.0 - 8.0   Protein, UA Positive (A) Negative   Urobilinogen, UA  0.2 or 1.0 E.U./dL   Nitrite, UA neg    Leukocytes, UA Trace (A) Negative   Appearance     Odor       Orders Placed This Encounter  Procedures  . POCT urinalysis dipstick    Plan:  Continued routine obstetrical care,   Return in about 2 weeks (around 11/13/2017) for LROB.

## 2017-10-30 NOTE — Patient Instructions (Signed)

## 2017-11-14 ENCOUNTER — Encounter: Payer: Self-pay | Admitting: Advanced Practice Midwife

## 2017-11-14 ENCOUNTER — Ambulatory Visit (INDEPENDENT_AMBULATORY_CARE_PROVIDER_SITE_OTHER): Payer: Medicaid Other | Admitting: Advanced Practice Midwife

## 2017-11-14 VITALS — BP 104/67 | HR 55 | Wt 161.5 lb

## 2017-11-14 DIAGNOSIS — Z3403 Encounter for supervision of normal first pregnancy, third trimester: Secondary | ICD-10-CM

## 2017-11-14 DIAGNOSIS — Z331 Pregnant state, incidental: Secondary | ICD-10-CM

## 2017-11-14 DIAGNOSIS — Z1389 Encounter for screening for other disorder: Secondary | ICD-10-CM

## 2017-11-14 DIAGNOSIS — Z3A35 35 weeks gestation of pregnancy: Secondary | ICD-10-CM

## 2017-11-14 LAB — POCT URINALYSIS DIPSTICK OB
Blood, UA: NEGATIVE
Glucose, UA: NEGATIVE — AB
KETONES UA: NEGATIVE
Leukocytes, UA: NEGATIVE
Nitrite, UA: NEGATIVE
POC,PROTEIN,UA: NEGATIVE

## 2017-11-14 NOTE — Progress Notes (Signed)
  G1P0 6274w6d Estimated Date of Delivery: 12/13/17  Blood pressure 104/67, pulse (!) 55, weight 161 lb 8 oz (73.3 kg), last menstrual period 03/08/2017.   BP weight and urine results all reviewed and noted.  Please refer to the obstetrical flow sheet for the fundal height and fetal heart rate documentation:  Patient reports good fetal movement, denies any bleeding and no rupture of membranes symptoms or regular contractions. Patient is without complaints. All questions were answered.   Physical Assessment:   Vitals:   11/14/17 1626  BP: 104/67  Pulse: (!) 55  Weight: 161 lb 8 oz (73.3 kg)  Body mass index is 30.52 kg/m.        Physical Examination:   General appearance: Well appearing, and in no distress  Mental status: Alert, oriented to person, place, and time  Skin: Warm & dry  Cardiovascular: Normal heart rate noted  Respiratory: Normal respiratory effort, no distress  Abdomen: Soft, gravid, nontender  Pelvic: Cervical exam performed         Extremities: Edema: Trace  Fetal Status:     Movement: Present    Results for orders placed or performed in visit on 11/14/17 (from the past 24 hour(s))  POC Urinalysis Dipstick OB   Collection Time: 11/14/17  4:27 PM  Result Value Ref Range   Color, UA     Clarity, UA     Glucose, UA Negative (A) (none)   Bilirubin, UA     Ketones, UA neg    Spec Grav, UA  1.010 - 1.025   Blood, UA neg    pH, UA  5.0 - 8.0   POC Protein UA Negative Negative, Trace   Urobilinogen, UA  0.2 or 1.0 E.U./dL   Nitrite, UA neg    Leukocytes, UA Negative Negative   Appearance     Odor       Orders Placed This Encounter  Procedures  . POC Urinalysis Dipstick OB    Plan:  Continued routine obstetrical care, waterbirth consent signed  Return in about 1 week (around 11/21/2017) for LROB.

## 2017-11-14 NOTE — Patient Instructions (Signed)

## 2017-11-21 ENCOUNTER — Ambulatory Visit (INDEPENDENT_AMBULATORY_CARE_PROVIDER_SITE_OTHER): Payer: Medicaid Other | Admitting: Women's Health

## 2017-11-21 ENCOUNTER — Encounter: Payer: Self-pay | Admitting: Women's Health

## 2017-11-21 VITALS — BP 111/77 | HR 66 | Wt 164.0 lb

## 2017-11-21 DIAGNOSIS — Z3A36 36 weeks gestation of pregnancy: Secondary | ICD-10-CM

## 2017-11-21 DIAGNOSIS — Z1389 Encounter for screening for other disorder: Secondary | ICD-10-CM

## 2017-11-21 DIAGNOSIS — Z3403 Encounter for supervision of normal first pregnancy, third trimester: Secondary | ICD-10-CM

## 2017-11-21 DIAGNOSIS — Z331 Pregnant state, incidental: Secondary | ICD-10-CM

## 2017-11-21 LAB — POCT URINALYSIS DIPSTICK OB
GLUCOSE, UA: NEGATIVE — AB
Ketones, UA: NEGATIVE
Nitrite, UA: NEGATIVE
POC,PROTEIN,UA: NEGATIVE

## 2017-11-21 NOTE — Progress Notes (Signed)
LOW-RISK PREGNANCY VISIT Patient name: Daly Sukup MRN [id number]  Date of birth: 03/06/1996 Chief Complaint:   Routine Prenatal Visit (GBS; GC/CHL)  History of Present Illness:   Vicki Maxwell is a 22 y.o. G1P0 female at [redacted]w[redacted]d with an Estimated Date of Delivery: 12/13/17 being seen today for ongoing management of a low-risk pregnancy.  Today she reports no complaints. Contractions: Irregular. Vag. Bleeding: None.  Movement: Present. denies leaking of fluid. Review of Systems:   Pertinent items are noted in HPI Denies abnormal vaginal discharge w/ itching/odor/irritation, headaches, visual changes, shortness of breath, chest pain, abdominal pain, severe nausea/vomiting, or problems with urination or bowel movements unless otherwise stated above. Pertinent History Reviewed:  Reviewed past medical,surgical, social, obstetrical and family history.  Reviewed problem list, medications and allergies. Physical Assessment:   Vitals:   11/21/17 1558  BP: 111/77  Pulse: 66  Weight: 164 lb (74.4 kg)  Body mass index is 30.99 kg/m.        Physical Examination:   General appearance: Well appearing, and in no distress  Mental status: Alert, oriented to person, place, and time  Skin: Warm & dry  Cardiovascular: Normal heart rate noted  Respiratory: Normal respiratory effort, no distress  Abdomen: Soft, gravid, nontender  Pelvic: Cervical exam deferred  Dilation: 1.5 Effacement (%): Thick Station: -2  Extremities: Edema: Trace  Fetal Status: Fetal Heart Rate (bpm): 140 Fundal Height: 34 cm Movement: Present Presentation: Vertex  Results for orders placed or performed in visit on 11/21/17 (from the past 24 hour(s))  POC Urinalysis Dipstick OB   Collection Time: 11/21/17  3:59 PM  Result Value Ref Range   Color, UA     Clarity, UA     Glucose, UA Negative (A) (none)   Bilirubin, UA     Ketones, UA neg    Spec Grav, UA  1.010 - 1.025   Blood, UA trace    pH, UA  5.0 - 8.0   POC  Protein UA Negative Negative, Trace   Urobilinogen, UA  0.2 or 1.0 E.U./dL   Nitrite, UA neg    Leukocytes, UA Moderate (2+) (A) Negative   Appearance     Odor      Assessment & Plan:  1) Low-risk pregnancy G1P0 at [redacted]w[redacted]d with an Estimated Date of Delivery: 12/13/17   2) Desires waterbirth, has attended class/signed consent, having trouble getting in touch w/ person they were going through for tub services. Printed out info for them, try to call another service, or they can use our tub/purchase kit   Meds: No orders of the defined types were placed in this encounter.  Labs/procedures today: gbs, gc/ct, sve  Plan:  Continue routine obstetrical care   Reviewed: Preterm labor symptoms and general obstetric precautions including but not limited to vaginal bleeding, contractions, leaking of fluid and fetal movement were reviewed in detail with the patient.  All questions were answered  Follow-up: Return in about 1 week (around 11/28/2017) for LROB.  Orders Placed This Encounter  Procedures  . GC/Chlamydia Probe Amp  . Strep Gp B NAA  . POC Urinalysis Dipstick OB   Kimberly R Booker CNM, WHNP-BC 11/21/2017 4:38 PM  

## 2017-11-21 NOTE — Patient Instructions (Addendum)
Vicki Maxwell, I greatly value your feedback.  If you receive a survey following your visit with [place] today, we appreciate you taking the time to fill it out.  Thanks, Knute Neu, CNM, WHNP-BC   Call the office [phone]) or go to [facility] if:  You begin to have strong, frequent contractions  Your water breaks.  Sometimes it is a big gush of fluid, sometimes it is just a trickle that keeps getting your panties wet or running down your legs  You have vaginal bleeding.  It is normal to have a small amount of spotting if your cervix was checked.   You don't feel your baby moving like normal.  If you don't, get you something to eat and drink and lay down and focus on feeling your baby move.  You should feel at least 10 movements in 2 hours.  If you don't, you should call the office or go to [place] Contractions Contractions of the uterus can occur throughout pregnancy, but they are not always a sign that you are in labor. You may have practice contractions called Braxton Hicks contractions. These false labor contractions are sometimes confused with true labor. What are [place] contractions? Braxton Hicks contractions are tightening movements that occur in the muscles of the uterus before labor. Unlike true labor contractions, these contractions do not result in opening (dilation) and thinning of the cervix. Toward the end of pregnancy (32-34 weeks), Braxton Hicks contractions can happen more often and may become stronger. These contractions are sometimes difficult to tell apart from true labor because they can be very uncomfortable. You should not feel embarrassed if you go to the hospital with false labor. Sometimes, the only way to tell if you are in true labor is for your health care provider to look for changes in the cervix. The health care provider will do a physical exam and may monitor your contractions. If you are not in true labor, the exam should  show that your cervix is not dilating and your water has not broken. If there are other health problems associated with your pregnancy, it is completely safe for you to be sent home with false labor. You may continue to have Braxton Hicks contractions until you go into true labor. How to tell the difference between true labor and false labor True labor  Contractions last 30-70 seconds.  Contractions become very regular.  Discomfort is usually felt in the top of the uterus, and it spreads to the lower abdomen and low back.  Contractions do not go away with walking.  Contractions usually become more intense and increase in frequency.  The cervix dilates and gets thinner. False labor  Contractions are usually shorter and not as strong as true labor contractions.  Contractions are usually irregular.  Contractions are often felt in the front of the lower abdomen and in the groin.  Contractions may go away when you walk around or change positions while lying down.  Contractions get weaker and are shorter-lasting as time goes on.  The cervix usually does not dilate or become thin. Follow these instructions at home:  Take over-the-counter and prescription medicines only as told by your health care provider.  Keep up with your usual exercises and follow other instructions from your health care provider.  Eat and drink lightly if you think you are going into labor.  If Braxton Hicks contractions are making you uncomfortable: ? Change your position from lying down  or resting to walking, or change from walking to resting. ? Sit and rest in a tub of warm water. ? Drink enough fluid to keep your urine pale yellow. Dehydration may cause these contractions. ? Do slow and deep breathing several times an hour.  Keep all follow-up prenatal visits as told by your health care provider. This is important. Contact a health care provider if:  You have a fever.  You have continuous pain in  your abdomen. Get help right away if:  Your contractions become stronger, more regular, and closer together.  You have fluid leaking or gushing from your vagina.  You pass blood-tinged mucus (bloody show).  You have bleeding from your vagina.  You have low back pain that you never had before.  You feel your baby's head pushing down and causing pelvic pressure.  Your baby is not moving inside you as much as it used to. Summary  Contractions that occur before labor are called Braxton Hicks contractions, false labor, or practice contractions.  Braxton Hicks contractions are usually shorter, weaker, farther apart, and less regular than true labor contractions. True labor contractions usually become progressively stronger and regular and they become more frequent.  Manage discomfort from [facility] contractions by changing position, resting in a warm bath, drinking plenty of water, or practicing deep breathing. This information is not intended to replace advice given to you by your health care provider. Make sure you discuss any questions you have with your health care provider. Document Released: 08/30/2016 Document Revised: 08/30/2016 Document Reviewed: 08/30/2016   Thinking About Doren Custard???  Why consider waterbirth? . Gentle birth for babies . Less pain medicine used in labor . May allow for passive descent/less pushing . May reduce perineal tears  . More mobility and instinctive maternal position changes . Increased maternal relaxation . Reduced blood pressure in labor  Is waterbirth safe? What are the risks of infection, drowning or other complications? . Infection o Very low risk (3.7 % for tub vs 4.8% for bed) o 7 in 8000 waterbirths with documented infection o Poorly cleaned equipment most common cause o Slightly lower group B strep transmission rate  . Drowning o Maternal:  - Very low risk   - Related to seizures or fainting o Newborn:  - Very low risk. No  evidence of increased risk of respiratory problems in multiple large studies - Physiological protection from breathing under water - Avoid underwater birth if there are any fetal complications - Once baby's head is out of the water, keep it out.  . Birth complication o Some reports of cord trauma, but risk decreased by bringing baby to surface gradually o No evidence of increased risk of shoulder dystocia. Mothers can usually change positions faster in water than in a bed, possibly aiding the maneuvers to free the shoulder.  You must attend a Doren Custard class at [facility]  3rd Wednesday of every month from 7-9pm  [employer] by calling [phone] or online at [email or web address]  Bring [place] the certificate from the class  Waterbirth supplies needed for [facility] patients:  Our practice has a Heritage manager in a Box tub (Regular size) at the hospital that you can borrow  You will need to purchase an accessory kit that has all needed supplies through [place] [id number] for kit, $65 for liner=$179+tax) or online through [employer]  Or you can purchase the supplies separately: o Single-use disposable tub liner for [employer] in a Box (REGULAR size) o New  garden hose labeled "lead-free", "suitable for drinking water", "non-toxic" OR "water potable" o Garden hose to remove the dirty water o Electric drain pump to remove water (We recommend 792 gallon per hour or greater pump.)  o Fish net o Bathing suit top (optional) o Long-handled mirror (optional)  [employer]- sells EVERYTHING waterbirth related, accessory kits, tubs, Psychologist, forensic (www.[facility]aborlad[facility].com) this is a great service if you don't want to be responsible for the set up/take down of tub! Just call the Labor Ladies and they will come do it for you for $200! This includes the rental fee for their tub, the accessory kit, set-up and take down   Things that would prevent you from  having a waterbirth:  Premature, <37wks  Previous cesarean birth  Presence of thick meconium-stained fluid  Multiple gestation (Twins, triplets, etc.)  Uncontrolled diabetes  Hypertension  Heavy vaginal bleeding  Non-reassuring fetal heart rate  Active infection (MRSA, etc.)  If your labor has to be induced  Other risk issues identified by your obstetrical provider    Elsevier Interactive Patient Education  [employer].

## 2017-11-23 LAB — STREP GP B NAA: Strep Gp B NAA: NEGATIVE

## 2017-11-24 LAB — GC/CHLAMYDIA PROBE AMP
CHLAMYDIA, DNA PROBE: NEGATIVE
NEISSERIA GONORRHOEAE BY PCR: NEGATIVE

## 2017-11-28 ENCOUNTER — Encounter: Payer: Self-pay | Admitting: Obstetrics & Gynecology

## 2017-11-28 ENCOUNTER — Ambulatory Visit (INDEPENDENT_AMBULATORY_CARE_PROVIDER_SITE_OTHER): Payer: Medicaid Other | Admitting: Obstetrics & Gynecology

## 2017-11-28 VITALS — BP 114/70 | HR 75 | Wt 166.0 lb

## 2017-11-28 DIAGNOSIS — Z3403 Encounter for supervision of normal first pregnancy, third trimester: Secondary | ICD-10-CM

## 2017-11-28 DIAGNOSIS — Z3A37 37 weeks gestation of pregnancy: Secondary | ICD-10-CM

## 2017-11-28 DIAGNOSIS — Z1389 Encounter for screening for other disorder: Secondary | ICD-10-CM

## 2017-11-28 DIAGNOSIS — Z331 Pregnant state, incidental: Secondary | ICD-10-CM

## 2017-11-28 LAB — POCT URINALYSIS DIPSTICK OB
Blood, UA: NEGATIVE
Glucose, UA: NEGATIVE — AB
KETONES UA: NEGATIVE
Leukocytes, UA: NEGATIVE
Nitrite, UA: NEGATIVE
POC,PROTEIN,UA: NEGATIVE

## 2017-11-28 NOTE — Progress Notes (Signed)
G1P0 6664w6d Estimated Date of Delivery: 12/13/17  Blood pressure 114/70, pulse 75, weight 166 lb (75.3 kg), last menstrual period 03/08/2017.   BP weight and urine results all reviewed and noted.  Please refer to the obstetrical flow sheet for the fundal height and fetal heart rate documentation:  Patient reports good fetal movement, denies any bleeding and no rupture of membranes symptoms or regular contractions. Patient is without complaints. All questions were answered.  Orders Placed This Encounter  Procedures  . POC Urinalysis Dipstick OB    Plan:  Continued routine obstetrical care,   Return in about 1 week (around 12/05/2017) for LROB.

## 2017-12-05 ENCOUNTER — Ambulatory Visit (INDEPENDENT_AMBULATORY_CARE_PROVIDER_SITE_OTHER): Payer: Medicaid Other | Admitting: Obstetrics and Gynecology

## 2017-12-05 VITALS — BP 106/65 | HR 68 | Wt 169.8 lb

## 2017-12-05 DIAGNOSIS — Z3403 Encounter for supervision of normal first pregnancy, third trimester: Secondary | ICD-10-CM

## 2017-12-05 DIAGNOSIS — Z3A38 38 weeks gestation of pregnancy: Secondary | ICD-10-CM

## 2017-12-05 DIAGNOSIS — Z331 Pregnant state, incidental: Secondary | ICD-10-CM

## 2017-12-05 DIAGNOSIS — Z1389 Encounter for screening for other disorder: Secondary | ICD-10-CM

## 2017-12-05 LAB — POCT URINALYSIS DIPSTICK OB
Blood, UA: NEGATIVE
Glucose, UA: NEGATIVE — AB
Ketones, UA: NEGATIVE
Leukocytes, UA: NEGATIVE
NITRITE UA: NEGATIVE
PROTEIN: NEGATIVE

## 2017-12-05 NOTE — Progress Notes (Signed)
Patient ID: Vicki Maxwell Launer, female   DOB: Jul 06, 1995, 22 y.o.   MRN: [id number]    LOW-RISK PREGNANCY VISIT Patient name: Vicki Maxwell Mcmannis MRN [id number]  Date of birth: Jul 06, 1995 Chief Complaint:   Routine Prenatal Visit  History of Present Illness:   Vicki Maxwell Gangi is a 22 y.o. G1P0 female at 1674w6d with an Estimated Date of Delivery: 12/13/17 being seen today for ongoing management of a low-risk pregnancy.  Today she reports slight swelling of the feet. She is accompanied by the father of the child and they have already attended childbirth classes. The baby is staying active.    Contractions: Not present. Vag. Bleeding: None.  Movement: Present. denies leaking of fluid. Review of Systems:   Pertinent items are noted in HPI Denies abnormal vaginal discharge w/ itching/odor/irritation, headaches, visual changes, shortness of breath, chest pain, abdominal pain, severe nausea/vomiting, or problems with urination or bowel movements unless otherwise stated above. Pertinent History Reviewed:  Reviewed past medical,surgical, social, obstetrical and family history.  Reviewed problem list, medications and allergies. Physical Assessment:   Vitals:   12/05/17 1557  BP: 106/65  Pulse: 68  Weight: 169 lb 12.8 oz (77 kg)  Body mass index is 32.08 kg/m.        Physical Examination:   General appearance: Well appearing, and in no distress  Mental status: Alert, oriented to person, place, and time  Skin: Warm & dry  Cardiovascular: Normal heart rate noted  Respiratory: Normal respiratory effort, no distress  Abdomen: Soft, gravid, nontender  Pelvic: Cervical exam deferred         Extremities: Edema: Trace  Fetal Status: Fetal Heart Rate (bpm): 131 Fundal Height: 36 cm Movement: Present    Results for orders placed or performed in visit on 12/05/17 (from the past 24 hour(s))  POC Urinalysis Dipstick OB   Collection Time: 12/05/17  3:54 PM  Result Value Ref Range   Color, UA     Clarity,  UA     Glucose, UA Negative (A) (none)   Bilirubin, UA     Ketones, UA neg    Spec Grav, UA     Blood, UA neg    pH, UA     POC Protein UA Negative Negative, Trace   Urobilinogen, UA     Nitrite, UA neg    Leukocytes, UA Negative Negative   Appearance     Odor      Assessment & Plan:  1) Low-risk pregnancy G1P0 at 6774w6d with an Estimated Date of Delivery: 12/13/17    Meds: No orders of the defined types were placed in this encounter.  Labs/procedures today: Doppler  Plan:  Continue routine obstetrical care  Reviewed: Term labor symptoms and general obstetric precautions including but not limited to vaginal bleeding, contractions, leaking of fluid and fetal movement were reviewed in detail with the patient.  All questions were answered   Follow-up: Return in about 1 week (around 12/12/2017) for LROB.  Orders Placed This Encounter  Procedures  . POC Urinalysis Dipstick OB   By signing my name below, I, Pietro CassisEmily Tufford, attest that this documentation has been prepared under the direction and in the presence of Tilda BurrowFerguson, Alaura Schippers V, MD. Electronically Signed: Pietro CassisEmily Tufford, Medical Scribe. 12/05/17. 4:07 PM.  I personally performed the services described in this documentation, which was SCRIBED in my presence. The recorded information has been reviewed and considered accurate. It has been edited as necessary during review. Tilda BurrowJohn V Bienvenido Proehl, MD

## 2017-12-12 ENCOUNTER — Encounter: Payer: Self-pay | Admitting: Obstetrics and Gynecology

## 2017-12-12 ENCOUNTER — Ambulatory Visit (INDEPENDENT_AMBULATORY_CARE_PROVIDER_SITE_OTHER): Payer: Medicaid Other | Admitting: Obstetrics and Gynecology

## 2017-12-12 VITALS — BP 116/79 | HR 72 | Wt 172.0 lb

## 2017-12-12 DIAGNOSIS — Z3483 Encounter for supervision of other normal pregnancy, third trimester: Secondary | ICD-10-CM

## 2017-12-12 DIAGNOSIS — Z1389 Encounter for screening for other disorder: Secondary | ICD-10-CM

## 2017-12-12 DIAGNOSIS — Z331 Pregnant state, incidental: Secondary | ICD-10-CM

## 2017-12-12 DIAGNOSIS — Z3A39 39 weeks gestation of pregnancy: Secondary | ICD-10-CM | POA: Diagnosis not present

## 2017-12-12 LAB — POCT URINALYSIS DIPSTICK OB
GLUCOSE, UA: NEGATIVE — AB
KETONES UA: NEGATIVE
Leukocytes, UA: NEGATIVE
Nitrite, UA: NEGATIVE
POC,PROTEIN,UA: NEGATIVE
RBC UA: NEGATIVE

## 2017-12-12 NOTE — Progress Notes (Signed)
Patient ID: Vicki Maxwell Walthall, female   DOB: Nov 28, 1995, 22 y.o.   MRN: [id number]    LOW-RISK PREGNANCY VISIT Patient name: Vicki Maxwell Deandrade MRN [id number]  Date of birth: Nov 28, 1995 Chief Complaint:   Routine Prenatal Visit  History of Present Illness:   Vicki Maxwell Wollschlager is a 22 y.o. G1P0 female at 864w6d with an Estimated Date of Delivery: 12/13/17 being seen today for ongoing management of a low-risk pregnancy.  Today she reports no complaints. She is accompanied by the father of the baby.   Contractions: Not present. Vag. Bleeding: None.  Movement: Present. denies leaking of fluid. Review of Systems:   Pertinent items are noted in HPI Denies abnormal vaginal discharge w/ itching/odor/irritation, headaches, visual changes, shortness of breath, chest pain, abdominal pain, severe nausea/vomiting, or problems with urination or bowel movements unless otherwise stated above. Pertinent History Reviewed:  Reviewed past medical,surgical, social, obstetrical and family history.  Reviewed problem list, medications and allergies. Physical Assessment:   Vitals:   12/12/17 1630  BP: 116/79  Pulse: 72  Weight: 172 lb (78 kg)  Body mass index is 32.5 kg/m.        Physical Examination:   General appearance: Well appearing, and in no distress  Mental status: Alert, oriented to person, place, and time  Skin: Warm & dry  Cardiovascular: Normal heart rate noted  Respiratory: Normal respiratory effort, no distress  Abdomen: Soft, gravid, nontender  Pelvic: Cervical exam deferred         Extremities: Edema: Trace  Fetal Status: Fetal Heart Rate (bpm): 129 Fundal Height: 37 cm Movement: Present    Results for orders placed or performed in visit on 12/12/17 (from the past 24 hour(s))  POC Urinalysis Dipstick OB   Collection Time: 12/12/17  4:31 PM  Result Value Ref Range   Color, UA     Clarity, UA     Glucose, UA Negative (A) (none)   Bilirubin, UA     Ketones, UA neg    Spec Grav, UA     Blood, UA neg    pH, UA     POC Protein UA Negative Negative, Trace   Urobilinogen, UA     Nitrite, UA neg    Leukocytes, UA Negative Negative   Appearance     Odor      Assessment & Plan:  1) Low-risk pregnancy G1P0 at 3164w6d with an Estimated Date of Delivery: 12/13/17    Meds: No orders of the defined types were placed in this encounter.  Labs/procedures today: Doppler  Plan:   1) Continue routine obstetrical care 2) NST 8/21, check cervix. Schedule IOL if necessary.  Follow-up: Return in 6 days (on 12/18/2017) for LROB, NST.  Orders Placed This Encounter  Procedures  . POC Urinalysis Dipstick OB   By signing my name below, I, Pietro CassisEmily Tufford, attest that this documentation has been prepared under the direction and in the presence of Tilda BurrowFerguson, Naziyah Tieszen V, MD. Electronically Signed: Pietro CassisEmily Tufford, Medical Scribe. 12/12/17. 5:02 PM.  I personally performed the services described in this documentation, which was SCRIBED in my presence. The recorded information has been reviewed and considered accurate. It has been edited as necessary during review. Tilda BurrowJohn V Neilani Duffee, MD

## 2017-12-15 ENCOUNTER — Other Ambulatory Visit: Payer: Self-pay

## 2017-12-15 ENCOUNTER — Inpatient Hospital Stay (HOSPITAL_COMMUNITY)
Admission: AD | Admit: 2017-12-15 | Discharge: 2017-12-17 | DRG: 807 | Disposition: A | Discharge status: Home / Self Care | Payer: Medicaid Other | Attending: Obstetrics and Gynecology | Admitting: Obstetrics and Gynecology

## 2017-12-15 ENCOUNTER — Encounter (HOSPITAL_COMMUNITY): Payer: Self-pay | Admitting: General Practice

## 2017-12-15 DIAGNOSIS — Z3A40 40 weeks gestation of pregnancy: Secondary | ICD-10-CM | POA: Diagnosis not present

## 2017-12-15 DIAGNOSIS — O4292 Full-term premature rupture of membranes, unspecified as to length of time between rupture and onset of labor: Principal | ICD-10-CM | POA: Diagnosis present

## 2017-12-15 DIAGNOSIS — Z3403 Encounter for supervision of normal first pregnancy, third trimester: Secondary | ICD-10-CM

## 2017-12-15 DIAGNOSIS — Z9889 Other specified postprocedural states: Secondary | ICD-10-CM

## 2017-12-15 DIAGNOSIS — I959 Hypotension, unspecified: Secondary | ICD-10-CM | POA: Diagnosis not present

## 2017-12-15 LAB — CBC
HEMATOCRIT: 36.5 % (ref 36.0–46.0)
Hemoglobin: 12.7 g/dL (ref 12.0–15.0)
MCH: 31 pg (ref 26.0–34.0)
MCHC: 34.8 g/dL (ref 30.0–36.0)
MCV: 89 fL (ref 78.0–100.0)
Platelets: 157 10*3/uL (ref 150–400)
RBC: 4.1 MIL/uL (ref 3.87–5.11)
RDW: 13.4 % (ref 11.5–15.5)
WBC: 12.3 10*3/uL — AB (ref 4.0–10.5)

## 2017-12-15 LAB — TYPE AND SCREEN
ABO/RH(D): B POS
Antibody Screen: NEGATIVE

## 2017-12-15 LAB — ABO/RH: ABO/RH(D): B POS

## 2017-12-15 LAB — RPR: RPR Ser Ql: NONREACTIVE

## 2017-12-15 LAB — POCT FERN TEST: POCT FERN TEST: POSITIVE

## 2017-12-15 MED ORDER — ACETAMINOPHEN 325 MG PO TABS
650.0000 mg | ORAL_TABLET | ORAL | Status: DC | PRN
Start: 2017-12-15 — End: 2017-12-17

## 2017-12-15 MED ORDER — OXYCODONE-ACETAMINOPHEN 5-325 MG PO TABS: 1.0000 | ORAL_TABLET | ORAL | Status: DC | PRN

## 2017-12-15 MED ORDER — LACTATED RINGERS IV SOLN
500.0000 mL | INTRAVENOUS | Status: DC | PRN
  Administered 2017-12-15: 1000 mL via INTRAVENOUS

## 2017-12-15 MED ORDER — SODIUM CHLORIDE 0.9 % IV SOLN: 250.0000 mL | INTRAVENOUS | Status: DC | PRN

## 2017-12-15 MED ORDER — ONDANSETRON HCL 4 MG PO TABS: 4.0000 mg | ORAL_TABLET | ORAL | Status: DC | PRN

## 2017-12-15 MED ORDER — LACTATED RINGERS IV SOLN
INTRAVENOUS | Status: DC
  Administered 2017-12-15 (×2): via INTRAVENOUS

## 2017-12-15 MED ORDER — SENNOSIDES-DOCUSATE SODIUM 8.6-50 MG PO TABS
2.0000 | ORAL_TABLET | ORAL | Status: DC
  Administered 2017-12-16 (×2): 2 via ORAL
  Filled 2017-12-15 (×2): qty 2

## 2017-12-15 MED ORDER — OXYTOCIN BOLUS FROM INFUSION
500.0000 mL | Freq: Once | INTRAVENOUS | Status: AC
  Administered 2017-12-15: 500 mL via INTRAVENOUS

## 2017-12-15 MED ORDER — SOD CITRATE-CITRIC ACID 500-334 MG/5ML PO SOLN: 30.0000 mL | ORAL | Status: DC | PRN

## 2017-12-15 MED ORDER — TERBUTALINE SULFATE 1 MG/ML IJ SOLN
0.2500 mg | Freq: Once | INTRAMUSCULAR | Status: DC | PRN
  Filled 2017-12-15: qty 1

## 2017-12-15 MED ORDER — PRENATAL MULTIVITAMIN CH
1.0000 | ORAL_TABLET | Freq: Every day | ORAL | Status: DC
  Administered 2017-12-16 – 2017-12-17 (×2): 1 via ORAL
  Filled 2017-12-15 (×2): qty 1

## 2017-12-15 MED ORDER — OXYCODONE-ACETAMINOPHEN 5-325 MG PO TABS: 2.0000 | ORAL_TABLET | ORAL | Status: DC | PRN

## 2017-12-15 MED ORDER — BENZOCAINE-MENTHOL 20-0.5 % EX AERO: 1.0000 "application " | INHALATION_SPRAY | CUTANEOUS | Status: DC | PRN

## 2017-12-15 MED ORDER — ONDANSETRON HCL 4 MG/2ML IJ SOLN: 4.0000 mg | INTRAMUSCULAR | Status: DC | PRN

## 2017-12-15 MED ORDER — COCONUT OIL OIL: 1.0000 "application " | TOPICAL_OIL | Status: DC | PRN

## 2017-12-15 MED ORDER — WITCH HAZEL-GLYCERIN EX PADS: 1.0000 "application " | MEDICATED_PAD | CUTANEOUS | Status: DC | PRN

## 2017-12-15 MED ORDER — DIBUCAINE 1 % RE OINT: 1.0000 "application " | TOPICAL_OINTMENT | RECTAL | Status: DC | PRN

## 2017-12-15 MED ORDER — MEASLES, MUMPS & RUBELLA VAC ~~LOC~~ INJ
0.5000 mL | INJECTION | Freq: Once | SUBCUTANEOUS | Status: DC
  Filled 2017-12-15: qty 0.5

## 2017-12-15 MED ORDER — FLEET ENEMA 7-19 GM/118ML RE ENEM: 1.0000 | ENEMA | RECTAL | Status: DC | PRN

## 2017-12-15 MED ORDER — LIDOCAINE HCL (PF) 1 % IJ SOLN
30.0000 mL | INTRAMUSCULAR | Status: DC | PRN
  Filled 2017-12-15: qty 30

## 2017-12-15 MED ORDER — ACETAMINOPHEN 325 MG PO TABS: 650.0000 mg | ORAL_TABLET | ORAL | Status: DC | PRN

## 2017-12-15 MED ORDER — TETANUS-DIPHTH-ACELL PERTUSSIS 5-2.5-18.5 LF-MCG/0.5 IM SUSP: 0.5000 mL | Freq: Once | INTRAMUSCULAR | Status: DC

## 2017-12-15 MED ORDER — ONDANSETRON HCL 4 MG/2ML IJ SOLN: 4.0000 mg | Freq: Four times a day (QID) | INTRAMUSCULAR | Status: DC | PRN

## 2017-12-15 MED ORDER — SIMETHICONE 80 MG PO CHEW: 80.0000 mg | CHEWABLE_TABLET | ORAL | Status: DC | PRN

## 2017-12-15 MED ORDER — SODIUM CHLORIDE 0.9% FLUSH: 3.0000 mL | INTRAVENOUS | Status: DC | PRN

## 2017-12-15 MED ORDER — OXYTOCIN 40 UNITS IN LACTATED RINGERS INFUSION - SIMPLE MED
2.5000 [IU]/h | INTRAVENOUS | Status: DC
  Filled 2017-12-15: qty 1000

## 2017-12-15 MED ORDER — IBUPROFEN 600 MG PO TABS
600.0000 mg | ORAL_TABLET | Freq: Four times a day (QID) | ORAL | Status: DC
  Administered 2017-12-16 – 2017-12-17 (×7): 600 mg via ORAL
  Filled 2017-12-15 (×7): qty 1

## 2017-12-15 MED ORDER — OXYTOCIN 40 UNITS IN LACTATED RINGERS INFUSION - SIMPLE MED
1.0000 m[IU]/min | INTRAVENOUS | Status: DC
  Administered 2017-12-15: 2 m[IU]/min via INTRAVENOUS

## 2017-12-15 MED ORDER — SODIUM CHLORIDE 0.9% FLUSH: 3.0000 mL | Freq: Two times a day (BID) | INTRAVENOUS | Status: DC

## 2017-12-15 MED ORDER — ZOLPIDEM TARTRATE 5 MG PO TABS: 5.0000 mg | ORAL_TABLET | Freq: Every evening | ORAL | Status: DC | PRN

## 2017-12-15 MED ORDER — DIPHENHYDRAMINE HCL 25 MG PO CAPS: 25.0000 mg | ORAL_CAPSULE | Freq: Four times a day (QID) | ORAL | Status: DC | PRN

## 2017-12-15 NOTE — MAU Note (Signed)
Water broke about 0050. Ctxs all day Saturday. 1cm last sve.

## 2017-12-15 NOTE — Progress Notes (Addendum)
Labor Progress Note Vicki Maxwell is a 22 y.o. G1P0 at 8037w2d by U/S admitted for PROM. Hypotension following epidural.   S:   O:  BP 103/83   Pulse 63   Temp 98.8 F (37.1 C) (Oral)   Resp 15   Ht 5\' 1"  (1.549 m)   Wt 75.8 kg   LMP 03/08/2017   BMI 31.55 kg/m  EFM: 150/modrate var/positive accels/no decels per last read. Not currently on EFM  CVE: Dilation: 9 Effacement (%): 100 Cervical Position: Anterior Station: 0, Plus 1 Presentation: Vertex Exam by:: [name] and Hazel SamsMary Early, RNs   A&P: 22 y.o. G1P0 1637w2d admitted for PROM #Labor: Progressing well. No pit running, interested in waterbirth #Pain: no epidural #FWB: Category 1 on last read #GBS negative  Mirian MoPeter Frank, MD 4:29 PM

## 2017-12-15 NOTE — Progress Notes (Signed)
Vicki Maxwell is a 22 y.o. G1P0 at 3825w2d by ultrasound admitted for rupture of membranes  Subjective:   Objective: BP (!) 104/55   Pulse 65   Temp 99 F (37.2 C) (Oral)   Resp 18   Ht 5\' 1"  (1.549 m)   Wt 75.8 kg   LMP 03/08/2017   BMI 31.55 kg/m  No intake/output data recorded. No intake/output data recorded.  FHT:  FHR: 140's bpm, variability: moderate,  accelerations:  Present,  decelerations:  Absent UC:   Mild irreg SVE:   Dilation: 7 Effacement (%): 100 Station: -1 Exam by:: Devra DoppAmber Knox, RN   Labs: Lab Results  Component Value Date   WBC 12.3 (H) 12/15/2017   HGB 12.7 12/15/2017   HCT 36.5 12/15/2017   MCV 89.0 12/15/2017   PLT 157 12/15/2017    Assessment / Plan: Spontaneous labor, progressing normally  Labor: Progressing normally Preeclampsia:  no signs or symptoms of toxicity Fetal Wellbeing:  Category I Pain Control:  Labor support without medications I/D:  n/a Anticipated MOD:  NSVD  Vicki Maxwell 12/15/2017, 11:17 AM

## 2017-12-15 NOTE — Progress Notes (Signed)
Dr Earlene PlaterWallace notified of pt's admission and status. Aware of ctx pattern, sve, light mec fld., desires water birth, [facility] gbs. [facility]

## 2017-12-15 NOTE — H&P (Addendum)
LABOR AND DELIVERY ADMISSION HISTORY AND PHYSICAL NOTE  Vicki Maxwell is a 22 y.o. female G1P0 with IUP at 3619w2d by presenting for SOL. States that her water broke at around 0050 this morning and the fluid was mucous colored. She is feeling contractions regularly every few minutes. She reports positive fetal movement. She denies vaginal bleeding. This is her first pregnancy and she denies any issues during pregnancy. She is planning on breastfeeding, is not planning for circumcision, and is undecided on method of contraception.  Pren[facility] History/Complications: [facility] Pregnancy complications:  - none  Past Medical History: Past Medical History:  Diagnosis Date  . Dysmenorrhea 08/26/2013  . Menstrual extraction 11/25/2013    Past Surgical History: Past Surgical History:  Procedure Laterality Date  . removal wisdom tooth      Obstetrical History: OB History    Gravida  1   Para      Term      Preterm      AB      Living        SAB      TAB      Ectopic      Multiple      Live Births              Social History: Social History   Socioeconomic History  . Marital status: Single    Spouse name: Not on file  . Number of children: Not on file  . Years of education: Not on file  . Highest education level: Not on file  Occupational History  . Not on file  Social Needs  . Financial resource strain: Not on file  . Food insecurity:    Worry: Not on file    Inability: Not on file  . Transportation needs:    Medical: Not on file    Non-medical: Not on file  Tobacco Use  . Smoking status: Never Smoker  . Smokeless tobacco: Never Used  Substance and Sexual Activity  . Alcohol use: No  . Drug use: No  . Sexual activity: Yes    Birth control/protection: None  Lifestyle  . Physical activity:    Days per week: Not on file    Minutes per session: Not on file  . Stress: Not on file  Relationships  . Social connections:    Talks on phone: Not  on file    Gets together: Not on file    Attends religious service: Not on file    Active member of club or organization: Not on file    Attends meetings of clubs or organizations: Not on file    Relationship status: Not on file  Other Topics Concern  . Not on file  Social History Narrative  . Not on file    Family History: Family History  Problem Relation Age of Onset  . Diabetes Father   . Hyperlipidemia Father   . Diabetes Paternal Grandmother   . Diabetes Paternal Grandfather   . Diabetes Paternal Aunt   . Diabetes Paternal Uncle   . Hyperlipidemia Cousin     Allergies: No Known Allergies  Medications Prior to Admission  Medication Sig Dispense Refill Last Dose  . Prenatal Vit-Iron Carbonyl-FA (PRENATAL PLUS IRON) 29-1 MG TABS Take 1 daily 30 tablet 12 12/14/2017 at Unknown time     Review of Systems  All systems reviewed and negative except as stated in HPI  Physical Exam Blood pressure 124/72, pulse 75, temperature 98.7 F (37.1  C), resp. rate 18, height 5\' 1"  (1.549 m), weight 75.8 kg, last menstrual period 03/08/2017. General appearance: alert, oriented, NAD Lungs: normal respiratory effort Heart: regular rate Abdomen: soft, non-tender; gravid, FH appropriate for GA Extremities: No calf swelling or tenderness Presentation: cephalic  Fetal monitoring: Cat I Uterine activity: Q4-5 min Dilation: 4 Effacement (%): 90 Station: -1 Exam by:: Quintella BatonJo Barham RNC  Prenatal labs: ABO, Rh: --/--/B POS (08/18 0230) Antibody: PENDING (08/18 0230) Rubella: 1.35 (01/30 1622) RPR: Non Reactive (05/17 0918)  HBsAg: Negative (01/30 1622)  HIV: Non Reactive (05/17 0918)  GC/Chlamydia: Negative (07/25) GBS: Negative (07/25 1700)  2-hr GTT: Passed (05/17) Genetic screening:  Negative Anatomy US: Normal  Prenatal Transfer Tool  Maternal Diabetes: No Genetic Screening: Declined Maternal Ultrasounds/Referrals: Normal Fetal Ultrasounds or other Referrals:  None Maternal  Substance Abuse:  No Significant Maternal Medications:  None Significant Maternal Lab Results: Lab values include: Group B Strep negative  Results for orders placed or performed during the hospital encounter of 12/15/17 (from the past 24 hour(s))  POCT fern test   Collection Time: 12/15/17  2:10 AM  Result Value Ref Range   POCT Fern Test Positive = ruptured amniotic membanes   CBC   Collection Time: 12/15/17  2:30 AM  Result Value Ref Range   WBC 12.3 (H) 4.0 - 10.5 K/uL   RBC 4.10 3.87 - 5.11 MIL/uL   Hemoglobin 12.7 12.0 - 15.0 g/dL   HCT 40.936.5 81.136.0 - 91.446.0 %   MCV 89.0 78.0 - 100.0 fL   MCH 31.0 26.0 - 34.0 pg   MCHC 34.8 30.0 - 36.0 g/dL   RDW 78.213.4 95.611.5 - 21.315.5 %   Platelets 157 150 - 400 K/uL  Type and screen [facility]   Collection Time: 12/15/17  2:30 AM  Result Value Ref Range   ABO/RH(D) B POS    Antibody Screen PENDING    Sample Expiration      12/18/2017 Performed at [facility], [address] [place], [place] [address]     Patient Active Problem List   Diagnosis Date Noted  . Supervision of normal first pregnancy 05/29/2017    Assessment: Vicki Maxwell is a 22 y.o. G1P0 at 6475w2d here for SOL. Spontaneous ROM at home around 0050 this morning. Patient desires water birth. Plan for expectant care, midwife will deliver.  #Labor: Expectant #FWB: Cat I #ID: GBS negative #MOF: Breast #MOC: Undecided #Circ:  No  Natasha MeadBryan A Chadwick, MS3 12/15/2017, 3:06 AM   OB FELLOW MEDICAL STUDENT NOTE ATTESTATION  I confirm that I have verified the information documented in the medical student's note and that I have also personally performed the physical exam and all medical decision making activities.   Vicki Maxwell is 22 y.o. G1P0 female at 9775w2d who presents for SOL. SROM at 0100 with light meconium. Pregnancy has been uncomplicated. GBS neg. Patient having boy, declines circ. Planning to breast feed. Undecided regarding contraception.    Patient desires water birth. Has taken classes, signed consent with CNM. Had been planning for intermittent monitoring. However, shortly after admission noted to have occasional variable decels. Continues to have variable and early decels. Baseline HR is appropriate. Moderate variability and +acels are present. Discussed with patient that I would recommend continuous monitoring for now.   Expectant management for labor. Maternal support for pain. Anticipate NSVD.   Marcy Sirenatherine Tiari Andringa, D.O. OB Fellow  12/15/2017, 7:20 AM

## 2017-12-15 NOTE — Anesthesia Pain Management Evaluation Note (Signed)
  [facility] Pain Management Visit Note  Patient: Vicki HarmanLorena Caudill, 22 y.o., female  "Hello I am a member of the anesthesia team at [facility]. We have an anesthesia team available at all times to provide care throughout the hospital, including epidural management and anesthesia for C-section. I don't know your plan for the delivery whether it a natural birth, water birth, IV sedation, nitrous supplementation, doula or epidural, but we want to meet your pain goals."   1.Was your pain managed to your expectations on prior hospitalizations?   No prior hospitalizations  2.What is your expectation for pain management during this hospitalization?     Labor support without medications  3.How can we help you reach that goal? Be available if patient changes her mind.  Record the patient's initial score and the patient's pain goal.   Pain: 8  Pain Goal: 10 [facility] wants you to be able to say your pain was always managed very well.  Jovani Flury 12/15/2017

## 2017-12-16 DIAGNOSIS — Z3A40 40 weeks gestation of pregnancy: Secondary | ICD-10-CM

## 2017-12-16 NOTE — Progress Notes (Signed)
POSTPARTUM PROGRESS NOTE  Post Partum Day 1 Subjective:  Vicki Maxwell is a 22 y.o. G1P1001 1358w2d s/p SVD.  No acute events overnight.  Pt denies problems with ambulating, voiding or po intake.  She denies nausea or vomiting.  Pain is well controlled. Lochia Moderate. Bleeding like a period, has to change pad q2-3hrs. No clots. She is starting to pass gas though no bowel movement yet. She reports no dizziness, headache, vision changes, chest pain, SOB.   Objective: Blood pressure (!) 88/49, pulse (!) 55, temperature 98.2 F (36.8 C), temperature source Oral, resp. rate 16, height 5\' 1"  (1.549 m), weight 75.8 kg, last menstrual period 03/08/2017, SpO2 97 %, breast-feeding.  Physical Exam:  General: alert, cooperative and no distress Lochia:normal flow Chest: no respiratory distress Heart:regular rate, distal pulses intact Abdomen: soft, nontender,  Uterine Fundus: firm, appropriately tender DVT Evaluation: No calf swelling or tenderness Extremities: no edema  Recent Labs    12/15/17 0230  HGB 12.7  HCT 36.5    Assessment/Plan:  ASSESSMENT: Vicki Maxwell is a 22 y.o. G1P1001 2958w2d s/p SVD.  Breastfeeding and Contraception undetermined at this point; I offered resources and counseling. She is still thinking about what she wants to do.   Keep an eye on blood pressures. Low reading this morning though not symptomatic. Taken before eating or drinking. EBL was 200 mL. Normal bleeding.    LOS: 1 day   Raynelle HighlandJuliana W Stone MS3 12/16/2017, 7:48 AM   OB FELLOW MEDICAL STUDENT NOTE ATTESTATION  I confirm that I have verified the information documented in the medical student's note and that I have also personally performed the physical exam and all medical decision making activities.   Vicki Maxwell is 22 y.o. 581P1001 female PPD #1 from SVD. Patient reports feeling well. Ambulating without difficulty, voiding normally, lochia is appropriate, pain is well controlled. Vital signs  stable. Patient was hypotensive this morning while laying in bed but asymptomatic. BP has now normalized. Physical exam benign with firm uterine fundus and no LE edema. Patient is breast feeding. Undecided for contraception. Circ declined. Plan for d/c tomorrow.   Marcy Sirenatherine Wallace, D.O. OB Fellow  12/16/2017, 12:01 PM

## 2017-12-16 NOTE — Lactation Note (Addendum)
This note was copied from a baby's chart. Lactation Consultation Note  Patient Name: Vicki Maxwell BJYNW'GToday's Date: 12/16/2017 Reason for consult: Initial assessment;Primapara;1st time breastfeeding;Term  p1 mother whose infant is now 3415 hours old.  Mother had just started to latch as I entered.  Offered to assist and she willingly agreed.  Baby swaddled in blanket.  I suggested STS and checking diaper prior to latching.  Baby had a void and a stool.  Father observed changing baby.  Mother's breasts are soft and non tender with everted nipples.  Assisted baby to latch in the football hold on the left breast after only a couple of attempts.  Lips flanged and wide mouth upon latching; no audible swallows at the present time.  Mother taught hand expression prior to latching and colostrum drops flowed easily from breast.  Colostrum container provided for any EBM mother may obtain from hand expression.  Mild storage times reviewed.  Encouraged mother to feed 8-12 times/24 hours or sooner if baby shows feeding cues.  Continue feeding STS and do breast massage and hand expression before/after feedings.    Mom made aware of O/P services, breastfeeding support groups, community resources, and our phone # for post-discharge questions.   [facility] and plans on obtaining a DEBP for home use.  She will be returning to work in 9 weeks.  Instructed to call for assistance as needed with latching.  Family present.   Maternal Data Formula Feeding for Exclusion: No Has patient been taught Hand Expression?: Yes Does the patient have breastfeeding experience prior to this delivery?: No  Feeding Feeding Type: Breast Fed Length of feed: 15 min(still feeding when I left the room)  LATCH Score Latch: Repeated attempts needed to sustain latch, nipple held in mouth throughout feeding, stimulation needed to elicit sucking reflex.  Audible Swallowing: None  Type of Nipple: Everted at rest  and after stimulation  Comfort (Breast/Nipple): Soft / non-tender  Hold (Positioning): Assistance needed to correctly position infant at breast and maintain latch.  LATCH Score: 6  Interventions Interventions: Breast feeding basics reviewed;Assisted with latch;Skin to skin;Breast massage;Hand express;Position options;Support pillows;Adjust position;Breast compression  Lactation Tools Discussed/Used WIC Program: Yes   Consult Status Consult Status: Follow-up Date: 12/17/17 Follow-up type: In-patient    Tinesha Siegrist R Michell Giuliano 12/16/2017, 11:27 AM

## 2017-12-17 MED ORDER — IBUPROFEN 600 MG PO TABS: 600.0000 mg | ORAL_TABLET | Freq: Four times a day (QID) | ORAL | 0 refills | Status: DC | PRN

## 2017-12-17 MED ORDER — ACETAMINOPHEN 325 MG PO TABS: 650.0000 mg | ORAL_TABLET | ORAL | Status: DC | PRN

## 2017-12-17 NOTE — Discharge Instructions (Signed)
Vaginal Delivery, Care After Refer to this sheet in the next few weeks. These discharge instructions provide you with information on caring for yourself after delivery. Your caregiver may also give you specific instructions. Your treatment has been planned according to the most current medical practices available, but problems sometimes occur. Call your caregiver if you have any problems or questions after you go home. HOME CARE INSTRUCTIONS 1. Take over-the-counter or prescription medicines only as directed by your caregiver or pharmacist. 2. Do not drink alcohol, especially if you are breastfeeding or taking medicine to relieve pain. 3. Do not smoke tobacco. 4. Continue to use good perineal care. Good perineal care includes: 1. Wiping your perineum from back to front 2. Keeping your perineum clean. 3. You can do sitz baths twice a day, to help keep this area clean 5. Do not use tampons, douche or have sex until your caregiver says it is okay. 6. Shower only and avoid sitting in submerged water, aside from sitz baths 7. Wear a well-fitting bra that provides breast support. 8. Eat healthy foods. 9. Drink enough fluids to keep your urine clear or pale yellow. 10. Eat high-fiber foods such as whole grain cereals and breads, brown rice, beans, and fresh fruits and vegetables every day. These foods may help prevent or relieve constipation. 11. Avoid constipation with high fiber foods or medications, such as miralax or metamucil 12. Follow your caregiver's recommendations regarding resumption of activities such as climbing stairs, driving, lifting, exercising, or traveling. 13. Talk to your caregiver about resuming sexual activities. Resumption of sexual activities is dependent upon your risk of infection, your rate of healing, and your comfort and desire to resume sexual activity. 14. Try to have someone help you with your household activities and your newborn for at least a few days after you leave  the hospital. 15. Rest as much as possible. Try to rest or take a nap when your newborn is sleeping. 16. Increase your activities gradually. 17. Keep all of your scheduled postpartum appointments. It is very important to keep your scheduled follow-up appointments. At these appointments, your caregiver will be checking to make sure that you are healing physically and emotionally. SEEK MEDICAL CARE IF:   You are passing large clots from your vagina. Save any clots to show your caregiver.  You have a foul smelling discharge from your vagina.  You have trouble urinating.  You are urinating frequently.  You have pain when you urinate.  You have a change in your bowel movements.  You have increasing redness, pain, or swelling near your vaginal incision (episiotomy) or vaginal tear.  You have pus draining from your episiotomy or vaginal tear.  Your episiotomy or vaginal tear is separating.  You have painful, hard, or reddened breasts.  You have a severe headache.  You have blurred vision or see spots.  You feel sad or depressed.  You have thoughts of hurting yourself or your newborn.  You have questions about your care, the care of your newborn, or medicines.  You are dizzy or light-headed.  You have a rash.  You have nausea or vomiting.  You were breastfeeding and have not had a menstrual period within 12 weeks after you stopped breastfeeding.  You are not breastfeeding and have not had a menstrual period by the 12th week after delivery.  You have a fever. SEEK IMMEDIATE MEDICAL CARE IF:   You have persistent pain.  You have chest pain.  You have shortness of breath.    You faint.  You have leg pain.  You have stomach pain.  Your vaginal bleeding saturates two or more sanitary pads in 1 hour. MAKE SURE YOU:   Understand these instructions.  Will watch your condition.  Will get help right away if you are not doing well or get worse. Document Released:  04/13/2000 Document Revised: 08/31/2013 Document Reviewed: 12/12/2011 ExitCare Patient Information 2015 [employer]. This information is not intended to replace advice given to you by your health care provider. Make sure you discuss any questions you have with your health care provider.  Sitz Bath A sitz bath is a warm water bath taken in the sitting position. The water covers only the hips and butt (buttocks). We recommend using one that fits in the toilet, to help with ease of use and cleanliness. It may be used for either healing or cleaning purposes. Sitz baths are also used to relieve pain, itching, or muscle tightening (spasms). The water may contain medicine. Moist heat will help you heal and relax.  HOME CARE  Take 3 to 4 sitz baths a day. 18. Fill the bathtub half-full with warm water. 19. Sit in the water and open the drain a little. 20. Turn on the warm water to keep the tub half-full. Keep the water running constantly. 21. Soak in the water for 15 to 20 minutes. 22. After the sitz bath, pat the affected area dry. GET HELP RIGHT AWAY IF: You get worse instead of better. Stop the sitz baths if you get worse. MAKE SURE YOU:  Understand these instructions.  Will watch your condition.  Will get help right away if you are not doing well or get worse. Document Released: 05/24/2004 Document Revised: 01/09/2012 Document Reviewed: 08/14/2010 ExitCare Patient Information 2015 [employer]. This information is not intended to replace advice given to you by your health care provider. Make sure you discuss any questions you have with your health care provider.    

## 2017-12-17 NOTE — Discharge Summary (Signed)
Obstetrical Discharge Summary  Date of Admission: 12/15/2017 Date of Discharge: 12/17/2017  Primary OB: [facility]  Gestational Age at Delivery: 2860w2d   Antepartum complications: none Reason for Admission: active labor Date of Delivery: 12/15/2017  Delivered By: Judie PetitM. Zerita Boersarlene Lawson, CNM Delivery Type: spontaneous vaginal delivery Intrapartum complications/course: None Anesthesia: none Placenta: Delivered and expressed via active management. Intact: yes. To pathology: no.  Laceration: none Episiotomy: none EBL: 200mL Baby: Liveborn female, APGARs 9/9, weight 3416 g.    Discharge Diagnosis: Delivered  Postpartum course: Uncomplicated Discharge Vital Signs:  Current Vital Signs 24h Vital Sign Ranges  T (!) 97.3 F (36.3 C) Temp  Avg: 97.9 F (36.6 C)  Min: 97.3 F (36.3 C)  Max: 98.2 F (36.8 C)  BP 105/64 BP  Min: 101/69  Max: 112/57  HR (!) 51 Pulse  Avg: 63  Min: 51  Max: 74  RR 18 Resp  Avg: 18.3  Min: 18  Max: 19  SaO2 100 % (Room Air) SpO2  Avg: 99.5 %  Min: 99 %  Max: 100 %       24 Hour I/O Current Shift I/O  Time Ins Outs No intake/output data recorded. No intake/output data recorded.   Discharge Exam:  NAD Perineum: deferred Abdomen: firm fundus below the umbilicus, NTTP, non distended, +bowel sounds.  RRR no MRGs CTAB  Ext: no c/c/e  Recent Labs  Lab 12/15/17 0230  WBC 12.3*  HGB 12.7  HCT 36.5  PLT 157   Disposition: Home  Rh Immune globulin given: not applicable Rubella vaccine given: not applicable Tdap vaccine given in AP or PP setting: yes  Contraception: undecided  Prenatal/Postnatal Panel: Conflict (See Lab Report): B POS/B POS Performed at [facility], [address] [place], [place] [address] Vicki Maxwell//Rubella Immune//Varicella Unknown//RPR negative//HIV negative/HepB Surface Ag negative//pap no abnormalities (date: 2017)//plans to breastfeed  Plan:  Vicki Maxwell was discharged to home in good condition. Follow-up appointment with [facility]  in 1 month for a PP visit  Future Appointments  Date Time Provider Department Center  01/20/2018  3:00 PM Cheral MarkerBooker, Kimberly R, CNM [facility]    Discharge Medications: Allergies as of 12/17/2017   No Known Allergies     Medication List    TAKE these medications   acetaminophen 325 MG tablet Commonly known as:  TYLENOL Take 2 tablets (650 mg total) by mouth every 4 (four) hours as needed (for pain scale < 4).   ibuprofen 600 MG tablet Commonly known as:  ADVIL,MOTRIN Take 1 tablet (600 mg total) by mouth every 6 (six) hours as needed for headache, mild pain or cramping.   PRENATAL PLUS IRON 29-1 MG Tabs Take 1 daily       Vicki Maxwell, Jr. MD Attending Center for [facility](Faculty Practice)

## 2017-12-17 NOTE — Lactation Note (Signed)
This note was copied from a baby's chart. Lactation Consultation Note  Patient Name: Vicki Maeola HarmanLorena Vitelli XBMWU'XToday's Date: 12/17/2017 Reason for consult: Follow-up assessment;Difficult latch  P1 mother whose infant is now 6137 hours old.  Mother trying to latch baby as I arrived.  Infant very fussy at the breast as mother is trying to latch.  Mother's breasts are soft and full with short shafted everted nipples.  Calmed baby down with gloved finger and then attempted to latch on the right breast in the football hold.  Baby continued to be fussy.  Tried to burp and console before latching again.  He would suck well 2-3 times and arch his back to self release.  Stomach gurgling.  Continued to relatch every time he released.  Again, he would take 2-3 sucks and pull back.  Burped again and tried the cross cradle hold and football hold on the left breast with the same results.  Mother did hand expression and pumped with her manual pump and baby had drops of colostrum to calm and soothe him.  Attempted one final time on the right breast in the football hold.  He latched well and had a strong suck but continued to arch and fuss at the breast.  Encouraged mother to put him STS and burp him.  She will continue to hold him like this until he shows feeding cues and will call me back for another latch assist.  Baby was calm when I left the room with mother holding him.  RN updated.    Maternal Data Formula Feeding for Exclusion: No Has patient been taught Hand Expression?: Yes Does the patient have breastfeeding experience prior to this delivery?: No  Feeding Feeding Type: Breast Fed Length of feed: 15 min(on and off)  LATCH Score Latch: Repeated attempts needed to sustain latch, nipple held in mouth throughout feeding, stimulation needed to elicit sucking reflex.  Audible Swallowing: A few with stimulation  Type of Nipple: Everted at rest and after stimulation  Comfort (Breast/Nipple): Soft /  non-tender  Hold (Positioning): Assistance needed to correctly position infant at breast and maintain latch.  LATCH Score: 7  Interventions Interventions: Breast feeding basics reviewed;Assisted with latch;Skin to skin;Breast massage;Hand express;Position options;Support pillows;Adjust position;Breast compression;Shells;Hand pump  Lactation Tools Discussed/Used Tools: Shells;Pump Shell Type: Inverted Breast pump type: Manual   Consult Status Consult Status: Complete Date: 12/18/17 Follow-up type: In-patient    Dora SimsBeth R Yazmine Sorey 12/17/2017, 9:34 AM

## 2017-12-17 NOTE — Lactation Note (Signed)
This note was copied from a baby's chart. Lactation Consultation Note Baby 28 hrs old. Starting to cluster feed. Baby jittery. Lab to draw blood glucose. Baby BF during and before draw. Mom has "V" shaped cone breast. Areola bulbous. Nipple compressible. Breast feel full/heavy. massaged w/mom's permission, hand expressed 2ml colostrum. Spoon fed baby. Baby positioned in football, fed well. Stops frequently falls asleep. Take away from breast cries. Explained to mom this is normal for cluster feeding. Positioned on cross cradle while lab drawing blood from heel. Baby nursing during labs and PKU.  Mom denies painful latch. No audible swallows heard. Noted when baby cries has thin frenulum visible under tongue. Baby can stick tongue past gums. When cries tongue curls on sides will raise tongue some to see frenulum. Mom has generalized edema to LE. Breast feel as if may have edema. Encouraged to wear bra. Shells given to evert nipples to be more compressible.  Discussed breast massage and pumping. Gave hand pump if pre-pump to evert nipples more, as well as post pumping if needed to relieve breast fullness. Milk storage reviewed.  Patient Name: Boy Maeola HarmanLorena Vonbehren ZOXWR'UToday's Date: 12/17/2017 Reason for consult: Follow-up assessment;1st time breastfeeding   Maternal Data    Feeding Feeding Type: Breast Fed Length of feed: 25 min  LATCH Score Latch: Repeated attempts needed to sustain latch, nipple held in mouth throughout feeding, stimulation needed to elicit sucking reflex.  Audible Swallowing: A few with stimulation  Type of Nipple: Everted at rest and after stimulation  Comfort (Breast/Nipple): Soft / non-tender  Hold (Positioning): Assistance needed to correctly position infant at breast and maintain latch.  LATCH Score: 7  Interventions Interventions: Breast feeding basics reviewed;Support pillows;Assisted with latch;Position options;Skin to skin;Expressed milk;Breast massage;Hand  express;Shells;Pre-pump if needed;Hand pump;Breast compression;Adjust position  Lactation Tools Discussed/Used Tools: Shells;Pump Shell Type: Inverted Breast pump type: Manual Pump Review: Setup, frequency, and cleaning;Milk Storage Initiated by:: Peri JeffersonL. Sinclair Alligood RN IBCLC Date initiated:: 12/17/17   Consult Status Consult Status: Follow-up Date: 12/18/17 Follow-up type: In-patient    Charyl DancerCARVER, Ennifer Harston G 12/17/2017, 12:55 AM

## 2017-12-18 ENCOUNTER — Other Ambulatory Visit: Payer: Medicaid Other | Admitting: Advanced Practice Midwife

## 2017-12-18 ENCOUNTER — Ambulatory Visit: Payer: Self-pay

## 2017-12-18 NOTE — Lactation Note (Signed)
This note was copied from a baby's chart. Lactation Consultation Note  Patient Name: Vicki Maxwell ZOXWR'UToday's Date: 12/18/2017 Reason for consult: Follow-up assessment;Primapara;1st time breastfeeding;Term;Infant weight loss;Nipple pain/trauma  3 visits for this mom today.  1st was at 10:17, waiting on decision for keeping baby has a baby patient due to weight loss and  Elevated serum Bilirubin or D/C. [facility].  Mom called LC and when LC was able to see her she was eating lunch.  3rd visit -  Baby awake, and with moms permission assisted with latch, best results with sustaining  Depth and latch was with a #24 NS, mom was comfortable and baby fed for 22 mins, multiple swallows and 40F SNS added and baby only took 5 ml.  When he released, LC showed dad how to PACE feed 15 ml of formula.  LC and RN shadow assisted mom  To pump both breast and the DEBP was explained to mom.  Mom was comfortable with #24 F both breast, with approx 30 ml EBM yield.  LC praised mom for her efforts and the milk coming in.  LC explained to mom until the baby is consistently sustaining the latch and depth at the breast  To use the #24 NS , instill EBM in the top and use the 40F SNS as shown.  Per mom and dad - feel comfortable with set up of the Nipple Shield and 40F SNS.  ( mom able to apply Nipple shield well)  Please see doc flow sheets for details.  LC plan:   Breast shells between feedings except when sleeping ( LC stressed the importance for elongating nipple/ areola complex )  Prior to latch - breast massage, hand express, pre-pump with hand pump if needed and  Apply #24 NS, with 40F SNS underneath, latch with firm support ( football worked well ) and Pensions consultantLatch.  If the baby is to fussy to Latch , give an appetizer from the bottle.  Also may finish off the supplementing with the bottle if the baby becomes sluggish at the breast.   LC recommended feeding the baby 1st breast 15 - 20  mins , if in a good feeding pattern let him finish, if not supplement afterwards. Supplement up to 30 ml per feeding.  Post pump after feeding 10 -15 mins , save milk for next feeding.  If EBM not available - use formula.   [facility]. Tomorrow for a DEBP WIC loaner.   LC spoke with Dr. Margo AyeHall and felt after working with this mother and baby the D/C should be held until tomorrow. Also gave her updates on the consult.   LC praised mom for her efforts and dad for his support.       Maternal Data Has patient been taught Hand Expression?: Yes  Feeding Feeding Type: Formula Nipple Type: Slow - flow Length of feed: 22 min(use of NS , 40F SNS, swallows )  LATCH Score Latch: Grasps breast easily, tongue down, lips flanged, rhythmical sucking.  Audible Swallowing: Spontaneous and intermittent  Type of Nipple: Everted at rest and after stimulation  Comfort (Breast/Nipple): Filling, red/small blisters or bruises, mild/mod discomfort  Hold (Positioning): Assistance needed to correctly position infant at breast and maintain latch.  LATCH Score: 8  Interventions Interventions: Breast feeding basics reviewed;DEBP;Shells;Hand pump  Lactation Tools Discussed/Used Tools: Pump;Shells;Flanges Nipple shield size: 20;24;Other (comment)(sized, the #24 NS fit the best ) Flange  Size: 24(#24 F a good fit / mom comfortable ) Breast pump type: Double-Electric Breast Pump Pump Review: Setup, frequency, and cleaning;Milk Storage Initiated by:: MAI  Date initiated:: 12/18/17   Consult Status Consult Status: Follow-up Date: 12/19/17 Follow-up type: In-patient    Vicki SprangMargaret Ann Loren Maxwell 12/18/2017, 4:50 PM

## 2017-12-18 NOTE — Lactation Note (Signed)
This note was copied from a baby's chart. Lactation Consultation Note  Patient Name: Vicki Maxwell XBJYN'WToday's Date: 12/18/2017 Reason for consult: Follow-up assessment;Infant weight loss(10 % weight loss, Serum Bili - 11.2 at 57 hours )  Baby is 62 hours old with increased weight loss today.  Per mom  baby the longest baby has latched at the breast is 10 mins at 12 MN .  Baby last fed at 7 am and 8 am and took total of 30 ml.  Has shells but has wore them much. Has used the hand pump .  LC discussed since the weight loss has increased the Dr. May decide to keep the baby one more day.  LC reviewed sore nipple and engorgement prevention and tx Also the need for a DEBP [facility] is ready to be set up with the DEBP.     Maternal Data    Feeding Feeding Type: (last fed at 0800) Nipple Type: Slow - flow Length of feed: (per mom )  LATCH Score                   Interventions Interventions: Breast feeding basics reviewed  Lactation Tools Discussed/Used Tools: Shells;Pump Shell Type: Inverted Breast pump type: Manual WIC Program: Yes   Consult Status Consult Status: Follow-up Date: 12/18/17(mom plans to call when completed breakfast ) Follow-up type: In-patient    Vicki Maxwell 12/18/2017, 10:17 AM

## 2017-12-19 ENCOUNTER — Ambulatory Visit: Payer: Self-pay

## 2017-12-19 NOTE — Lactation Note (Addendum)
This note was copied from a baby's chart. Lactation Consultation Note: I arrived in Mothers room to observe her attempting to latch infant. Mother is using a #24 nipple shield.  Infant observed on and off the shield with a shallow latch.   Assist mother with proper positioning with good pillow support.  Infant latched on without the shield.  Mother taught breast compression.  Father taught to assist with flanging infants lips for wide gape.  Observed suckling and swallows for 20-25 mins.   Observed slight compression to lower half of mothers nipple.  Infant placed in football hold. Infant obtained better depth. Observed that infant has a tight short anterior frenula.  Encouraged to continue to cue base feed and to feed at least 8-12 times in 24 hours.  Discussed cluster feeding.   Advised mother to attempt to latch infant without shield and if unable to latch use shield.   Mother advised to continue to post pump and supplement infant with ebm.  Mother has a harmony hand pump. Advised to pump each breast for 15 mins.  [facility].  Reviewed treatment and prevention of engorgement.   [facility]  Patient Name: Vicki Maeola HarmanLorena Stoney KVQQV'ZToday's Date: 12/19/2017 Reason for consult: Follow-up assessment   Maternal Data    Feeding Feeding Type: Breast Fed Length of feed: 25 min  LATCH Score Latch: Repeated attempts needed to sustain latch, nipple held in mouth throughout feeding, stimulation needed to elicit sucking reflex.  Audible Swallowing: Spontaneous and intermittent  Type of Nipple: Everted at rest and after stimulation  Comfort (Breast/Nipple): Filling, red/small blisters or bruises, mild/mod discomfort  Hold (Positioning): Assistance needed to correctly position infant at breast and maintain latch.  LATCH Score: 7  Interventions Interventions: Skin to skin;Hand express;Breast compression;Adjust  position;Support pillows;Position options;Expressed milk;Hand pump  Lactation Tools Discussed/Used Tools: (Breastfed without the shield) Nipple shield size: 24   Consult Status Consult Status: Complete    Michel BickersKendrick, Debbe Crumble McCoy 12/19/2017, 9:43 AM

## 2018-01-02 DIAGNOSIS — Z029 Encounter for administrative examinations, unspecified: Secondary | ICD-10-CM

## 2018-01-20 ENCOUNTER — Encounter: Payer: Self-pay | Admitting: Women's Health

## 2018-01-20 ENCOUNTER — Ambulatory Visit (INDEPENDENT_AMBULATORY_CARE_PROVIDER_SITE_OTHER): Payer: Medicaid Other | Admitting: Women's Health

## 2018-01-20 DIAGNOSIS — Z3202 Encounter for pregnancy test, result negative: Secondary | ICD-10-CM

## 2018-01-20 DIAGNOSIS — Z30011 Encounter for initial prescription of contraceptive pills: Secondary | ICD-10-CM

## 2018-01-20 LAB — POCT URINE PREGNANCY: Preg Test, Ur: NEGATIVE

## 2018-01-20 MED ORDER — NORETHINDRONE 0.35 MG PO TABS: 1.0000 | ORAL_TABLET | Freq: Every day | ORAL | 11 refills | Status: DC

## 2018-01-20 NOTE — Progress Notes (Signed)
   POSTPARTUM VISIT Patient name: Vicki HarmanLorena Sherbert MRN [id number]  Date of birth: 02-21-1996 Chief Complaint:   postpartum visit (wants to start birth control pills)  History of Present Illness:   Vicki Maxwell is a 22 y.o. 801P1001 Hispanic female being seen today for a postpartum visit. She is 4 weeks postpartum following a spontaneous vaginal delivery at 40.2 gestational weeks. Anesthesia: none. I have fully reviewed the prenatal and intrapartum course. Pregnancy uncomplicated. Postpartum course has been uncomplicated. Bleeding no bleeding. Bowel function is normal. Bladder function is normal.  Patient is not sexually active. Last sexual activity: prior to birth of baby.  Contraception method is wants POPs.  [place] Postpartum Depression Screening: negative. Score 3.   Last pap 05/16/15.  Results were normal .  No LMP recorded.  Baby's course has been uncomplicated. Baby is feeding by breast & bottle Review of Systems:   Pertinent items are noted in HPI Denies Abnormal vaginal discharge w/ itching/odor/irritation, headaches, visual changes, shortness of breath, chest pain, abdominal pain, severe nausea/vomiting, or problems with urination or bowel movements. Pertinent History Reviewed:  Reviewed past medical,surgical, obstetrical and family history.  Reviewed problem list, medications and allergies. OB History  Gravida Para Term Preterm AB Living  1 1 1     1   SAB TAB Ectopic Multiple Live Births        0 1    # Outcome Date GA Lbr Len/2nd Weight Sex Delivery Anes PTL Lv  1 Term 12/15/17 [id number] 19:52 / 02:04 7 lb 8.5 oz (3.416 kg) M Vag-Spont None  LIV   Physical Assessment:   Vitals:   01/20/18 1514  BP: 115/76  Pulse: 91  Weight: 150 lb 8 oz (68.3 kg)  Height: 5\' 1"  (1.549 m)  Body mass index is 28.44 kg/m.       Physical Examination:   General appearance: alert, well appearing, and in no distress  Mental status: alert, oriented to person, place, and time  Skin: warm &  dry   Cardiovascular: normal heart rate noted   Respiratory: normal respiratory effort, no distress   Breasts: deferred, no complaints   Abdomen: soft, non-tender   Pelvic: VULVA: normal appearing vulva with no masses, tenderness or lesions, UTERUS: uterus is normal size, shape, consistency and nontender  Rectal: no hemorrhoids  Extremities: no edema       Results for orders placed or performed in visit on 01/20/18 (from the past 24 hour(s))  POCT urine pregnancy   Collection Time: 01/20/18  3:17 PM  Result Value Ref Range   Preg Test, Ur Negative Negative    Assessment & Plan:  1) Postpartum exam 2) 4 wks s/p SVB 3) Breast & bottlefeeding 4) Depression screening 5) Contraception counseling, pt prefers Rx micronor w/ 11RF, understands has to take at exact same time daily to be effective, if late taking use condom as back-up   Meds:  Meds ordered this encounter  Medications  . norethindrone (MICRONOR,CAMILA,ERRIN) 0.35 MG tablet    Sig: Take 1 tablet (0.35 mg total) by mouth daily.    Dispense:  1 Package    Refill:  11    Order Specific Question:   Supervising Provider    Answer:   Lazaro ArmsEURE, LUTHER H [[id number]]    Follow-up: Return for Jan for , Pap & physical.   Orders Placed This Encounter  Procedures  . POCT urine pregnancy    Cheral MarkerKimberly R Madalee Altmann CNM, [facility]-BC 01/20/2018 3:38 PM

## 2018-01-20 NOTE — Patient Instructions (Signed)
Norethindrone tablets (contraception) What is this medicine? NORETHINDRONE (nor eth IN drone) is an oral contraceptive. The product contains a female hormone known as a progestin. It is used to prevent pregnancy. This medicine may be used for other purposes; ask your health care provider or pharmacist if you have questions. COMMON BRAND NAME(S): Camila, Deblitane 28-Day, Errin, Heather, Jencycla, Jolivette, Lyza, Nor-QD, Nora-BE, Norlyroc, Ortho Micronor, Sharobel 28-Day What should I tell my health care provider before I take this medicine? They need to know if you have any of these conditions: -blood vessel disease or blood clots -breast, cervical, or vaginal cancer -diabetes -heart disease -kidney disease -liver disease -mental depression -migraine -seizures -stroke -vaginal bleeding -an unusual or allergic reaction to norethindrone, other medicines, foods, dyes, or preservatives -pregnant or trying to get pregnant -breast-feeding How should I use this medicine? Take this medicine by mouth with a glass of water. You may take it with or without food. Follow the directions on the prescription label. Take this medicine at the same time each day and in the order directed on the package. Do not take your medicine more often than directed. Contact your pediatrician regarding the use of this medicine in children. Special care may be needed. This medicine has been used in female children who have started having menstrual periods. A patient package insert for the product will be given with each prescription and refill. Read this sheet carefully each time. The sheet may change frequently. Overdosage: If you think you have taken too much of this medicine contact a poison control center or emergency room at once. NOTE: This medicine is only for you. Do not share this medicine with others. What if I miss a dose? Try not to miss a dose. Every time you miss a dose or take a dose late your chance of  pregnancy increases. When 1 pill is missed (even if only 3 hours late), take the missed pill as soon as possible and continue taking a pill each day at the regular time (use a back up method of birth control for the next 48 hours). If more than 1 dose is missed, use an additional birth control method for the rest of your pill pack until menses occurs. Contact your health care professional if more than 1 dose has been missed. What may interact with this medicine? Do not take this medicine with any of the following medications: -amprenavir or fosamprenavir -bosentan This medicine may also interact with the following medications: -antibiotics or medicines for infections, especially rifampin, rifabutin, rifapentine, and griseofulvin, and possibly penicillins or tetracyclines -aprepitant -barbiturate medicines, such as phenobarbital -carbamazepine -felbamate -modafinil -oxcarbazepine -phenytoin -ritonavir or other medicines for HIV infection or AIDS -St. John's wort -topiramate This list may not describe all possible interactions. Give your health care provider a list of all the medicines, herbs, non-prescription drugs, or dietary supplements you use. Also tell them if you smoke, drink alcohol, or use illegal drugs. Some items may interact with your medicine. What should I watch for while using this medicine? Visit your doctor or health care professional for regular checks on your progress. You will need a regular breast and pelvic exam and Pap smear while on this medicine. Use an additional method of birth control during the first cycle that you take these tablets. If you have any reason to think you are pregnant, stop taking this medicine right away and contact your doctor or health care professional. If you are taking this medicine for hormone related problems, it   may take several cycles of use to see improvement in your condition. This medicine does not protect you against HIV infection (AIDS)  or any other sexually transmitted diseases. What side effects may I notice from receiving this medicine? Side effects that you should report to your doctor or health care professional as soon as possible: -breast tenderness or discharge -pain in the abdomen, chest, groin or leg -severe headache -skin rash, itching, or hives -sudden shortness of breath -unusually weak or tired -vision or speech problems -yellowing of skin or eyes Side effects that usually do not require medical attention (report to your doctor or health care professional if they continue or are bothersome): -changes in sexual desire -change in menstrual flow -facial hair growth -fluid retention and swelling -headache -irritability -nausea -weight gain or loss This list may not describe all possible side effects. Call your doctor for medical advice about side effects. You may report side effects to FDA at 1-800-FDA-1088. Where should I keep my medicine? Keep out of the reach of children. Store at room temperature between 15 and 30 degrees C (59 and 86 degrees F). Throw away any unused medicine after the expiration date. NOTE: This sheet is a summary. It may not cover all possible information. If you have questions about this medicine, talk to your doctor, pharmacist, or health care provider.  2018 Elsevier/Gold Standard (2012-01-04 16:41:35)  

## 2018-03-29 ENCOUNTER — Emergency Department (HOSPITAL_COMMUNITY)
Admission: EM | Admit: 2018-03-29 | Discharge: 2018-03-29 | Disposition: A | Discharge status: Home / Self Care | Payer: Medicaid Other | Attending: Emergency Medicine | Admitting: Emergency Medicine

## 2018-03-29 ENCOUNTER — Emergency Department (HOSPITAL_COMMUNITY): Payer: Medicaid Other

## 2018-03-29 ENCOUNTER — Encounter (HOSPITAL_COMMUNITY): Payer: Self-pay | Admitting: Emergency Medicine

## 2018-03-29 ENCOUNTER — Other Ambulatory Visit: Payer: Self-pay

## 2018-03-29 DIAGNOSIS — Z79899 Other long term (current) drug therapy: Secondary | ICD-10-CM | POA: Diagnosis not present

## 2018-03-29 DIAGNOSIS — K8020 Calculus of gallbladder without cholecystitis without obstruction: Secondary | ICD-10-CM | POA: Diagnosis not present

## 2018-03-29 DIAGNOSIS — R112 Nausea with vomiting, unspecified: Secondary | ICD-10-CM | POA: Diagnosis not present

## 2018-03-29 DIAGNOSIS — R1011 Right upper quadrant pain: Secondary | ICD-10-CM | POA: Diagnosis present

## 2018-03-29 LAB — URINALYSIS, ROUTINE W REFLEX MICROSCOPIC
Bilirubin Urine: NEGATIVE
Glucose, UA: NEGATIVE mg/dL
Hgb urine dipstick: NEGATIVE
Ketones, ur: NEGATIVE mg/dL
Nitrite: NEGATIVE
Protein, ur: NEGATIVE mg/dL
Specific Gravity, Urine: 1.023 (ref 1.005–1.030)
pH: 7 (ref 5.0–8.0)

## 2018-03-29 LAB — COMPREHENSIVE METABOLIC PANEL
ALBUMIN: 4.7 g/dL (ref 3.5–5.0)
ALT: 17 U/L (ref 0–44)
AST: 17 U/L (ref 15–41)
Alkaline Phosphatase: 77 U/L (ref 38–126)
Anion gap: 8 (ref 5–15)
BUN: 11 mg/dL (ref 6–20)
CO2: 24 mmol/L (ref 22–32)
Calcium: 9.1 mg/dL (ref 8.9–10.3)
Chloride: 105 mmol/L (ref 98–111)
Creatinine, Ser: 0.66 mg/dL (ref 0.44–1.00)
GFR calc Af Amer: >60 mL/min (ref >60)
GFR calc non Af Amer: >60 mL/min (ref >60)
Glucose, Bld: 131 mg/dL — ABNORMAL HIGH (ref 70–99)
Potassium: 3.4 mmol/L — ABNORMAL LOW (ref 3.5–5.1)
Sodium: 137 mmol/L (ref 135–145)
Total Bilirubin: 0.5 mg/dL (ref 0.3–1.2)
Total Protein: 8 g/dL (ref 6.5–8.1)

## 2018-03-29 LAB — CBC
HCT: 44.7 % (ref 36.0–46.0)
HEMOGLOBIN: 15 g/dL (ref 12.0–15.0)
MCH: 29.8 pg (ref 26.0–34.0)
MCHC: 33.6 g/dL (ref 30.0–36.0)
MCV: 88.7 fL (ref 80.0–100.0)
Platelets: 249 10*3/uL (ref 150–400)
RBC: 5.04 MIL/uL (ref 3.87–5.11)
RDW: 11.7 % (ref 11.5–15.5)
WBC: 9.8 10*3/uL (ref 4.0–10.5)
nRBC: 0 % (ref 0.0–0.2)

## 2018-03-29 LAB — LIPASE, BLOOD: LIPASE: 37 U/L (ref 11–51)

## 2018-03-29 LAB — I-STAT BETA HCG BLOOD, ED (MC, WL, AP ONLY): I-stat hCG, quantitative: <5 m[IU]/mL (ref <5)

## 2018-03-29 MED ORDER — HYDROCODONE-ACETAMINOPHEN 5-325 MG PO TABS: 1.0000 | ORAL_TABLET | ORAL | 0 refills | Status: DC | PRN

## 2018-03-29 MED ORDER — SODIUM CHLORIDE 0.9 % IV BOLUS
1000.0000 mL | Freq: Once | INTRAVENOUS | Status: AC
  Administered 2018-03-29: 1000 mL via INTRAVENOUS

## 2018-03-29 MED ORDER — MORPHINE SULFATE (PF) 4 MG/ML IV SOLN
4.0000 mg | Freq: Once | INTRAVENOUS | Status: AC
  Administered 2018-03-29: 4 mg via INTRAVENOUS
  Filled 2018-03-29: qty 1

## 2018-03-29 MED ORDER — ONDANSETRON HCL 4 MG/2ML IJ SOLN
4.0000 mg | Freq: Once | INTRAMUSCULAR | Status: AC
  Administered 2018-03-29: 4 mg via INTRAVENOUS
  Filled 2018-03-29: qty 2

## 2018-03-29 MED ORDER — ONDANSETRON 4 MG PO TBDP: 4.0000 mg | ORAL_TABLET | Freq: Three times a day (TID) | ORAL | 0 refills | Status: DC | PRN

## 2018-03-29 NOTE — ED Provider Notes (Signed)
[facility] EMERGENCY DEPARTMENT Provider Note   CSN: [id number] Arrival date & time: 03/29/18  7829     History   Chief Complaint Chief Complaint  Patient presents with  . Abdominal Pain    HPI Vicki Maxwell is a 22 y.o. female presenting for abdominal pain and emesis.  She states that she awoke this morning at 3 AM with right upper quadrant abdominal pain that she describes as a dull sensation, constant but coming in waves.  Patient states that pain fluctuates from moderate to severe without identifiable aggravating or alleviating factors.  Patient states that she has had 3 episodes of vomiting since 3 AM this morning initially it was the food that she ate last night however she states that her last episode of vomiting was yellow fluid.  At time of evaluation patient describes her pain as a mild, constant dull sensation worsened with palpation.  Patient states that she experienced a similar episode of pain however not as severe and without emesis on Monday however this resolved spontaneously.  Patient denies history of fever, dysuria, hematuria, vaginal bleeding/discharge, chest pain, shortness of breath, radiation of pain, diarrhea, cough or any other concerns at this time.  She states that her last bowel movement was a few hours prior to arrival and was normal, denies dark stool or blood.  Patient denies breast-feeding.  HPI  Past Medical History:  Diagnosis Date  . Dysmenorrhea 08/26/2013  . Menstrual extraction 11/25/2013    Patient Active Problem List   Diagnosis Date Noted  . Supervision of normal first pregnancy 05/29/2017    Past Surgical History:  Procedure Laterality Date  . removal wisdom tooth       OB History    Gravida  1   Para  1   Term  1   Preterm      AB      Living  1     SAB      TAB      Ectopic      Multiple  0   Live Births  1            Home Medications    Prior to Admission medications   Medication Sig Start Date  End Date Taking? Authorizing Provider  chlorhexidine (PERIDEX) 0.12 % solution SWISH IN MOUTH THOROUGHLY WITH 15 ML OF LIQUID FOR 30 SECONDS THEN SPIT IT OUT. DO NOT RINSE WITH WATER. NOTHING BY MOUTH FOR 30 MINUTES AFT 01/18/18  Yes [provider]  norethindrone (MICRONOR,CAMILA,ERRIN) 0.35 MG tablet Take 1 tablet (0.35 mg total) by mouth daily. 01/20/18  Yes Cheral Marker, CNM  Prenatal Vit-Iron Carbonyl-FA (PRENATAL PLUS IRON) 29-1 MG TABS Take 1 daily 05/10/17  Yes Cyril Mourning A, NP  PREVIDENT 5000 DRY MOUTH 1.1 % GEL dental gel AFTER REGULAR BRUSHING FLOSSING AND RINSING BRUSH WITH PEA SIZE OF THIS PASTE FOR 2 MINUTES. SPIT IT OUT THOROUGHLY. LIGHTLY RINSE. NOTHING 01/18/18  Yes [provider]  HYDROcodone-acetaminophen (NORCO/VICODIN) 5-325 MG tablet Take 1 tablet by mouth every 4 (four) hours as needed. 03/29/18   Harlene Salts A, PA-C  ondansetron (ZOFRAN ODT) 4 MG disintegrating tablet Take 1 tablet (4 mg total) by mouth every 8 (eight) hours as needed for nausea or vomiting. 03/29/18   Bill Salinas, PA-C    Family History Family History  Problem Relation Age of Onset  . Diabetes Father   . Hyperlipidemia Father   . Diabetes Paternal Grandmother   . Diabetes Paternal  Grandfather   . Diabetes Paternal Aunt   . Diabetes Paternal Uncle   . Hyperlipidemia Cousin     Social History Social History   Tobacco Use  . Smoking status: Never Smoker  . Smokeless tobacco: Never Used  Substance Use Topics  . Alcohol use: No  . Drug use: No     Allergies   Patient has no known allergies.   Review of Systems Review of Systems  Constitutional: Negative.  Negative for chills and fever.  HENT: Negative.  Negative for rhinorrhea and sore throat.   Eyes: Negative.  Negative for visual disturbance.  Respiratory: Negative.  Negative for cough and shortness of breath.   Cardiovascular: Negative.  Negative for chest pain.  Gastrointestinal: Positive for  abdominal pain, nausea and vomiting (Bilious emesis). Negative for blood in stool and diarrhea.  Genitourinary: Negative.  Negative for dysuria, hematuria, vaginal bleeding and vaginal discharge.  Musculoskeletal: Negative.  Negative for arthralgias and myalgias.  Skin: Negative.  Negative for rash.  Neurological: Negative.  Negative for dizziness, weakness and headaches.   Physical Exam Updated Vital Signs BP 123/70 (BP Location: Right Arm)   Pulse 65   Temp 98 F (36.7 C) (Oral)   Resp 18   Ht 5\' 1"  (1.549 m)   Wt 67.1 kg   LMP 03/10/2018   SpO2 98%   BMI 27.96 kg/m   Physical Exam  Constitutional: She appears well-developed and well-nourished. No distress.  HENT:  Head: Normocephalic and atraumatic.  Right Ear: External ear normal.  Left Ear: External ear normal.  Nose: Nose normal.  Eyes: Pupils are equal, round, and reactive to light. Conjunctivae and EOM are normal.  Neck: Trachea normal, normal range of motion, full passive range of motion without pain and phonation normal. Neck supple. No tracheal deviation present.  Cardiovascular: Normal rate, regular rhythm, normal heart sounds and intact distal pulses.  Pulses:      Dorsalis pedis pulses are 2+ on the right side, and 2+ on the left side.       Posterior tibial pulses are 2+ on the right side, and 2+ on the left side.  Pulmonary/Chest: Effort normal and breath sounds normal. No respiratory distress. She has no decreased breath sounds. She has no rhonchi. She exhibits no tenderness, no crepitus and no deformity.  Abdominal: Soft. She exhibits no distension. There is tenderness in the right upper quadrant. There is positive Murphy's sign. There is no rigidity, no rebound, no guarding, no CVA tenderness and no tenderness at McBurney's point.  Appearance normal. No erythema, jaundice or ascites. Abdomen is soft. No rebound or guarding. Bowel sounds are present. No distension. No McBurney's point tenderness. Negative Rovsing,  psoas and obturator sign.  Positive Murphy sign   Musculoskeletal: Normal range of motion.       Right lower leg: Normal.       Left lower leg: Normal.  Feet:  Right Foot:  Protective Sensation: 3 sites tested. 3 sites sensed.  Left Foot:  Protective Sensation: 3 sites tested. 3 sites sensed.  Neurological: She is alert. GCS eye subscore is 4. GCS verbal subscore is 5. GCS motor subscore is 6.  Speech is clear and goal oriented, follows commands Major Cranial nerves without deficit, no facial droop Normal strength in upper and lower extremities bilaterally including dorsiflexion and plantar flexion, strong and equal grip strength Sensation normal to light touch Moves extremities without ataxia, coordination intact Normal gait  Skin: Skin is warm and dry.  Psychiatric:  She has a normal mood and affect. Her behavior is normal.   ED Treatments / Results  Labs (all labs ordered are listed, but only abnormal results are displayed) Labs Reviewed  COMPREHENSIVE METABOLIC PANEL - Abnormal; Notable for the following components:      Result Value   Potassium 3.4 (*)    Glucose, Bld 131 (*)    All other components within normal limits  URINALYSIS, ROUTINE W REFLEX MICROSCOPIC - Abnormal; Notable for the following components:   APPearance HAZY (*)    Leukocytes, UA SMALL (*)    Bacteria, UA RARE (*)    All other components within normal limits  LIPASE, BLOOD  CBC  I-STAT BETA HCG BLOOD, ED ([facility], [facility], [facility] ONLY)    EKG None  Radiology US Abdomen Complete  Result Date: 03/29/2018 CLINICAL DATA:  Acute abdominal pain EXAM: ABDOMEN ULTRASOUND COMPLETE COMPARISON:  None. FINDINGS: Gallbladder: Small echogenic shadowing mobile gallstones noted. No Murphy's sign or pericholecystic fluid. Wall thickness is normal measuring 1.3 mm. Common bile duct: Diameter: 6.5 mm, mildly prominent. No intrahepatic biliary dilatation. Liver: No focal lesion identified. Within normal limits in parenchymal  echogenicity. Portal vein is patent on color Doppler imaging with normal direction of blood flow towards the liver. IVC: No abnormality visualized. Pancreas: Visualized portion unremarkable. Spleen: Size and appearance within normal limits. Right Kidney: Length: 11.8 cm. Echogenicity within normal limits. No mass or hydronephrosis visualized. Left Kidney: Length: 11.9 cm. Echogenicity within normal limits. No mass or hydronephrosis visualized. Abdominal aorta: No aneurysm visualized. Other findings: No ascites or free fluid IMPRESSION: Cholelithiasis without signs of cholecystitis or biliary dilatation. Prominent common bile duct measures 6.5 mm without intrahepatic biliary dilatation, remains nonspecific. No other acute finding by ultrasound. Electronically Signed   By: Judie Petit.  Shick M.D.   On: 03/29/2018 10:38    Procedures Procedures (including critical care time)  Medications Ordered in ED Medications  sodium chloride 0.9 % bolus 1,000 mL (1,000 mLs Intravenous New Bag/Given 03/29/18 0915)  morphine 4 MG/ML injection 4 mg (4 mg Intravenous Given 03/29/18 0915)  ondansetron (ZOFRAN) injection 4 mg (4 mg Intravenous Given 03/29/18 0914)     Initial Impression / Assessment and Plan / ED Course  I have reviewed the triage vital signs and the nursing notes.  Pertinent labs & imaging results that were available during my care of the patient were reviewed by me and considered in my medical decision making (see chart for details).     Beta hCG negative This within normal limits CMP nonacute UA with small leukocytes and bacteria- no urinary symptoms, doubt UTI. CBC within normal limits Abdominal ultrasound:   Cholelithiasis without signs of cholecystitis or biliary dilatation.    Prominent common bile duct measures 6.5 mm without intrahepatic  biliary dilatation, remains nonspecific.    No other acute finding by ultrasound.   22 year old otherwise healthy female presenting today for right  upper quadrant abdominal pain and bilious emesis. Patient is afebrile, non-toxic appearing, sitting comfortably on examination table. Patient's pain and other symptoms adequately managed in emergency department. Fluid bolus given. Labs, imaging and vitals reviewed with the patient.  Patient does not meet the SIRS or Sepsis criteria.  On repeat exam patient does not have a surgical abdomen and there are no peritoneal signs.  Based on work-up today suspect that patient likely with symptomatic cholelithiasis on presentation, symptoms have resolved.  I have informed patient that she will likely need to have her gallbladder removed and have given  her referral to general surgery to have this scheduled.  There is no indication for emergent cholecystectomy at this time.  I have informed the patient that despite work-up today not showing infection or obstruction that there is a possibility that this may develop in the future and to return to the emergency department for any new or worsening symptoms or if symptoms are not controlled with medication.  Patient prescribed a short course of Norco as well as Zofran for symptomatic control.  Patient states that she is not breast-feeding.  Patient informed not to drive or operate machinery while using Norco.  Patient has been informed to avoid fatty/greasy or spicy foods.  At discharge patient is afebrile, not tachycardic, not hypotensive, well-appearing/nontoxic and in no acute distress.  At this time there does not appear to be any evidence of an acute emergency medical condition and the patient appears stable for discharge with appropriate outpatient follow up. Diagnosis was discussed with patient who verbalizes understanding of care plan and is agreeable to discharge. I have discussed return precautions with patient who verbalize understanding of return precautions. Patient strongly encouraged to follow-up with their PCP within 1 week and with general surgeon on Monday. All  questions answered.  Patient was also seen and evaluated by Dr. Estell Harpin during this visit who agrees with work-up and discharged with symptomatic control, general surgery referral outpatient at this time.  Note: Portions of this report may have been transcribed using voice recognition software. Every effort was made to ensure accuracy; however, inadvertent computerized transcription errors may still be present. Final Clinical Impressions(s) / ED Diagnoses   Final diagnoses:  Calculus of gallbladder without cholecystitis without obstruction    ED Discharge Orders         Ordered    HYDROcodone-acetaminophen (NORCO/VICODIN) 5-325 MG tablet  Every 4 hours PRN     03/29/18 1118    ondansetron (ZOFRAN ODT) 4 MG disintegrating tablet  Every 8 hours PRN     03/29/18 [address] 03/29/18 1136    Bethann Berkshire, MD 03/30/18 [phone]

## 2018-03-29 NOTE — Discharge Instructions (Signed)
You have been diagnosed today with gallstones.  At this time there does not appear to be the presence of an emergent medical condition, however there is always the potential for conditions to change. Please read and follow the below instructions.  Please return to the Emergency Department immediately for any new or worsening symptoms. Please be sure to follow up with your Primary Care Provider next week regarding your visit today; please call their office to schedule an appointment even if you are feeling better for a follow-up visit. Your ultrasound today showed gallstones in your gallbladder.  There was no sign of infection or obstruction at this time however there is always the possibility that infection or obstruction can occur.  Return to emergency department immediately for new or worsening symptoms if you have recurrent pain/vomiting or if symptoms are not managed by the medication given to you today.  Please call the general surgeon Dr. Lovell SheehanJenkins to schedule an appointment for next week.  It is very likely that your gallbladder will need to be taken out and this can be scheduled with Dr. Lovell SheehanJenkins office. You may use the Norco as prescribed for severe pain.  Do not drive or operate heavy machinery while taking this medication because will make you drowsy.  You may use the Zofran as prescribed for nausea/vomiting.  Get help right away if: You have pain from a gallbladder attack that lasts for more than 2 hours. You have abdominal pain that lasts for more than 5 hours. You have a fever or chills. You have persistent nausea and vomiting. You develop jaundice (Yellow skin or eyes). You have dark urine or light-colored stools.  Please read the additional information packets attached to your discharge summary.  Do not take your medicine if  develop an itchy rash, swelling in your mouth or lips, or difficulty breathing.

## 2018-03-29 NOTE — ED Triage Notes (Signed)
Patient c/o right upper abd pain. Per patient started Monday as mild pain and went away, pain returned last night after eating spicy food. Per patient nausea and vomiting. Denies and diarrhea, urinary symptoms, or fevers. Per patient last normal BM this morning-no blood noted.

## 2018-03-30 HISTORY — PX: CHOLECYSTECTOMY: SHX55

## 2019-01-27 ENCOUNTER — Ambulatory Visit: Payer: Medicaid Other | Admitting: Adult Health

## 2019-06-08 ENCOUNTER — Ambulatory Visit: Payer: Medicaid Other | Attending: Internal Medicine

## 2019-06-08 ENCOUNTER — Other Ambulatory Visit: Payer: Self-pay

## 2019-06-08 DIAGNOSIS — Z20822 Contact with and (suspected) exposure to covid-19: Secondary | ICD-10-CM

## 2019-06-09 LAB — NOVEL CORONAVIRUS, NAA: SARS-CoV-2, NAA: NOT DETECTED

## 2019-06-30 ENCOUNTER — Ambulatory Visit: Payer: Medicaid Other | Attending: Internal Medicine

## 2019-06-30 ENCOUNTER — Other Ambulatory Visit: Payer: Self-pay

## 2019-06-30 DIAGNOSIS — Z20822 Contact with and (suspected) exposure to covid-19: Secondary | ICD-10-CM

## 2019-07-01 ENCOUNTER — Encounter: Payer: Self-pay | Admitting: *Deleted

## 2019-07-01 LAB — NOVEL CORONAVIRUS, NAA: SARS-CoV-2, NAA: DETECTED — AB

## 2019-09-14 ENCOUNTER — Ambulatory Visit: Payer: Medicaid Other | Attending: Internal Medicine

## 2019-09-14 DIAGNOSIS — Z23 Encounter for immunization: Secondary | ICD-10-CM

## 2019-09-14 NOTE — Progress Notes (Signed)
   Covid-19 Vaccination Clinic  Name:  Vicki Maxwell    MRN: [id number] DOB: 1996-02-12  09/14/2019  [name] was observed post Covid-19 immunization for 15 minutes without incident. She was provided with Vaccine Information Sheet and instruction to access the V-Safe system.   [name] was instructed to call 911 with any severe reactions post vaccine: Marland Kitchen Difficulty breathing  . Swelling of face and throat  . A fast heartbeat  . A bad rash all over body  . Dizziness and weakness   Immunizations Administered    Name Date Dose VIS Date Route   Pfizer COVID-19 Vaccine 09/14/2019  5:04 PM 0.3 mL 06/24/2018 Intramuscular   Manufacturer: [employer], [employer]   Lot: [id number]   NDC: [address]-3

## 2020-09-15 IMAGING — US US ABDOMEN COMPLETE
1 series · 13 of 25 positions shown · non-contrast
Comparison: None.

CLINICAL DATA: Acute abdominal pain

EXAM:
ABDOMEN ULTRASOUND COMPLETE

[Series 1: us abdomen complete · 13 of 126 slices shown]
[im 1/126]
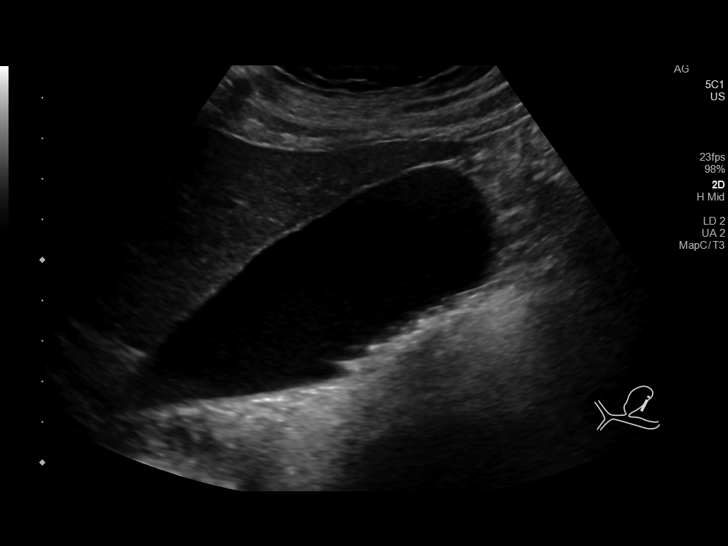
[im 11/126]
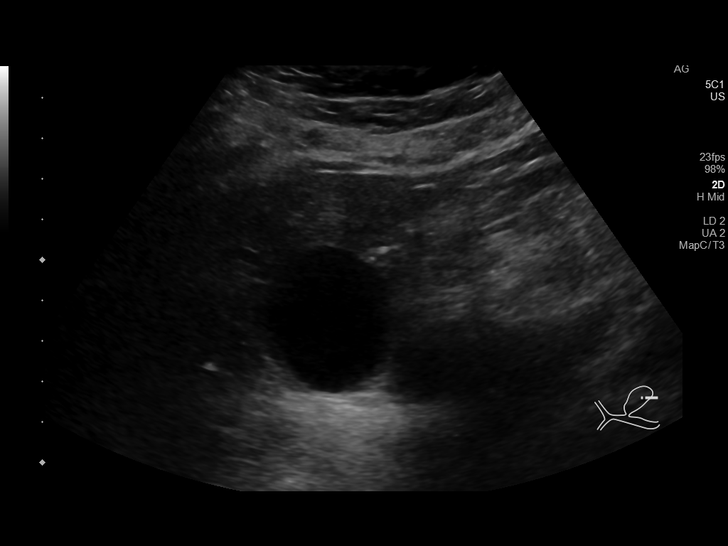
[im 21/126]
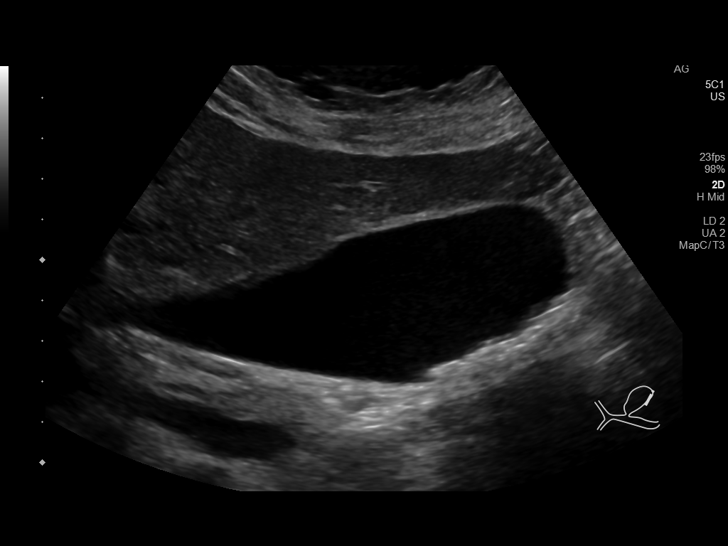
[im 32/126]
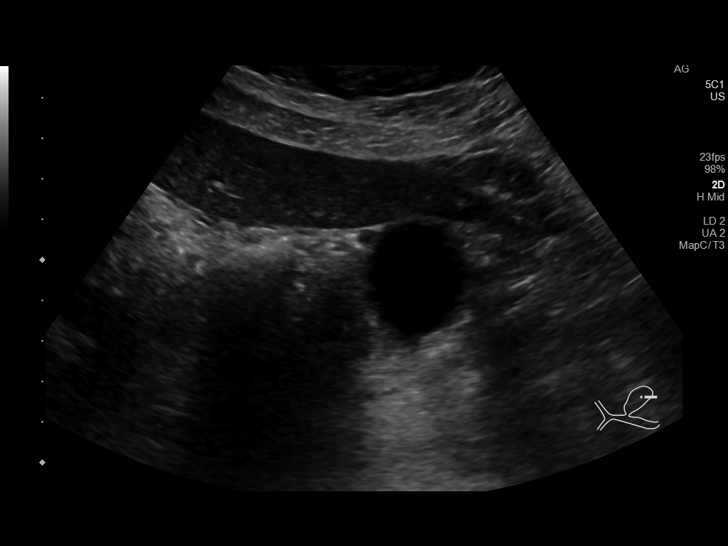
[im 42/126]
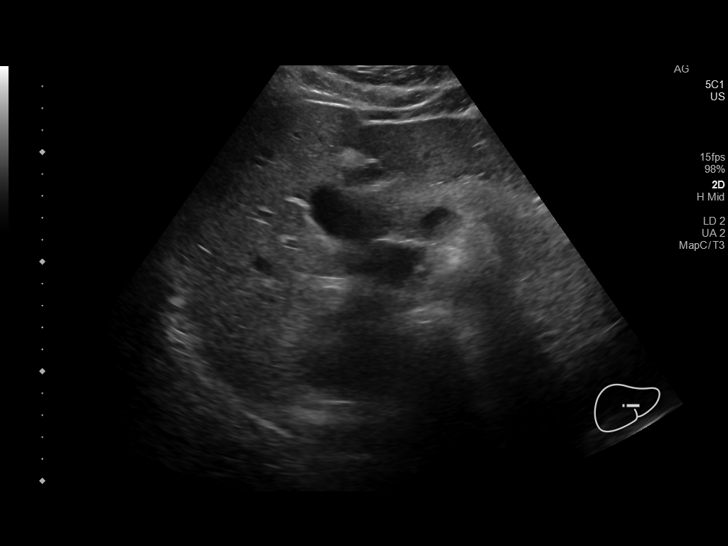
[im 53/126]
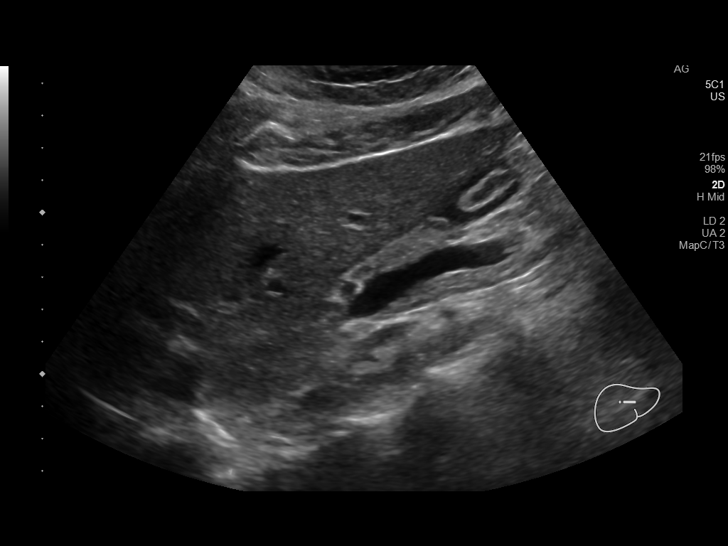
[im 63/126]
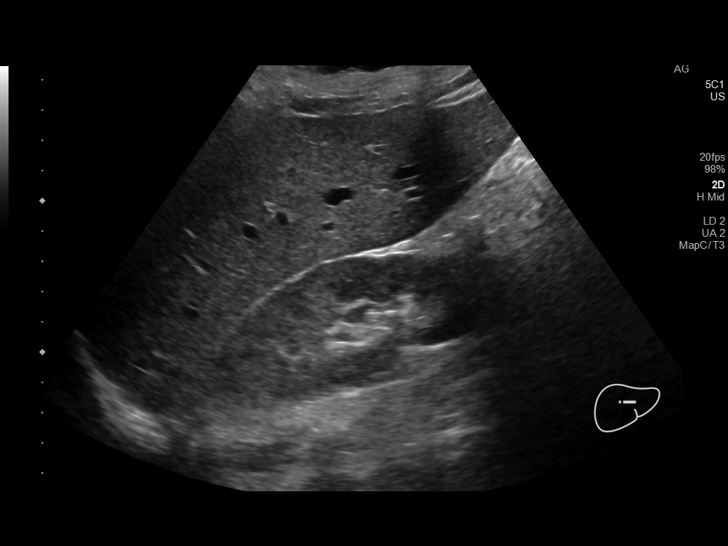
[im 73/126]
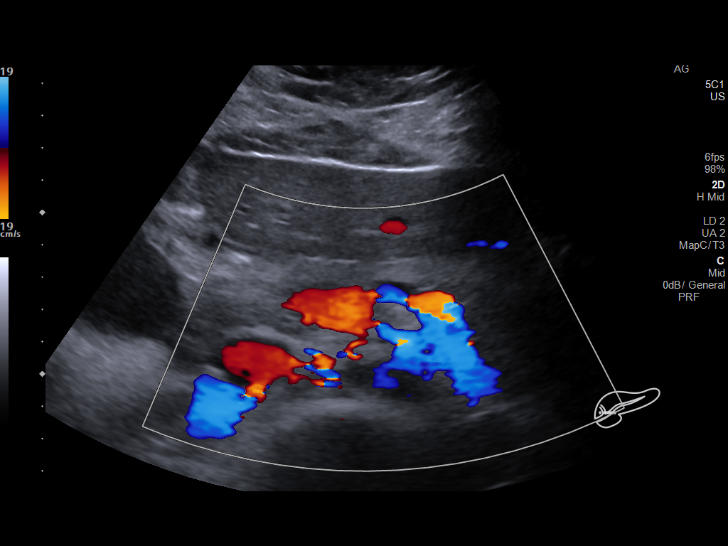
[im 84/126]
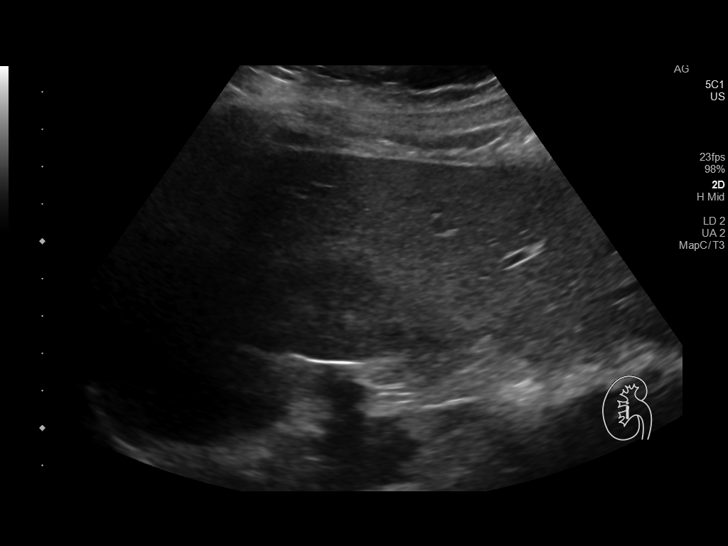
[im 94/126]
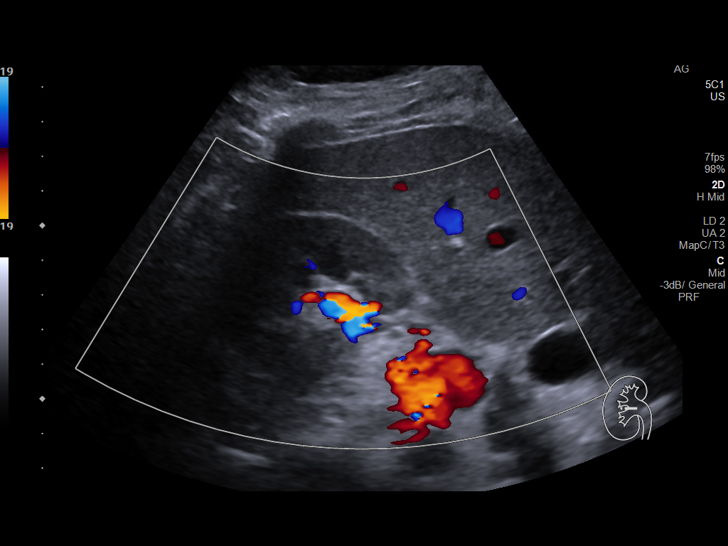
[im 105/126]
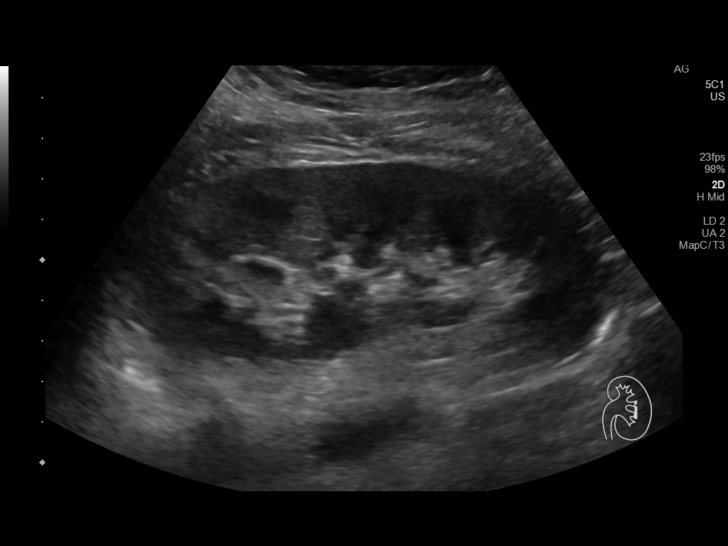
[im 115/126]
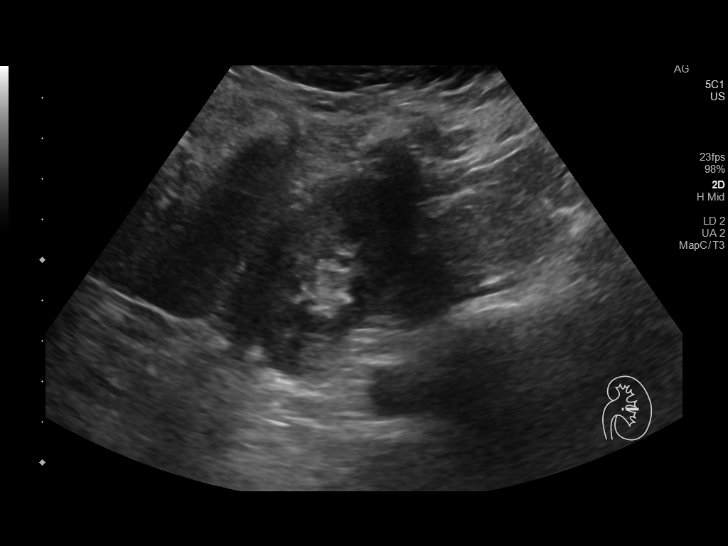
[im 126/126]
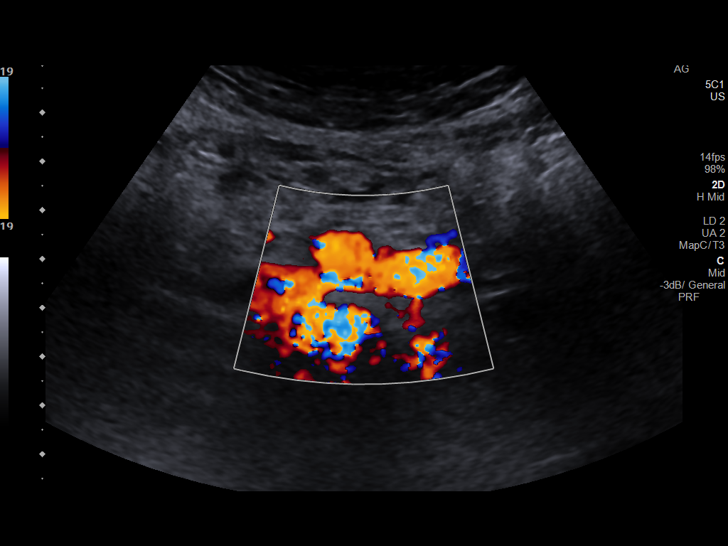

[13 of 25 positions shown; findings below may reference images not displayed]

FINDINGS: Gallbladder: Small echogenic shadowing mobile gallstones noted. No
Murphy's sign or pericholecystic fluid. Wall thickness is normal
measuring 1.3 mm.

Common bile duct: Diameter: 6.5 mm, mildly prominent. No
intrahepatic biliary dilatation.

Liver: No focal lesion identified. Within normal limits in
parenchymal echogenicity. Portal vein is patent on color Doppler
imaging with normal direction of blood flow towards the liver.

IVC: No abnormality visualized.

Pancreas: Visualized portion unremarkable.

Spleen: Size and appearance within normal limits.

Right Kidney: Length: 11.8 cm. Echogenicity within normal limits. No
mass or hydronephrosis visualized.

Left Kidney: Length: 11.9 cm. Echogenicity within normal limits. No
mass or hydronephrosis visualized.

Abdominal aorta: No aneurysm visualized.

Other findings: No ascites or free fluid
IMPRESSION: Cholelithiasis without signs of cholecystitis or biliary dilatation.

Prominent common bile duct measures 6.5 mm without intrahepatic
biliary dilatation, remains nonspecific.

No other acute finding by ultrasound.

## 2021-02-08 ENCOUNTER — Encounter: Payer: Self-pay | Admitting: Adult Health

## 2021-02-08 ENCOUNTER — Ambulatory Visit (INDEPENDENT_AMBULATORY_CARE_PROVIDER_SITE_OTHER): Payer: BC Managed Care – PPO | Admitting: Adult Health

## 2021-02-08 ENCOUNTER — Other Ambulatory Visit: Payer: Self-pay

## 2021-02-08 ENCOUNTER — Other Ambulatory Visit (HOSPITAL_COMMUNITY)
Admission: RE | Admit: 2021-02-08 | Discharge: 2021-02-08 | Disposition: A | Discharge status: Home / Self Care | Payer: BC Managed Care – PPO | Source: Ambulatory Visit | Attending: Adult Health | Admitting: Adult Health

## 2021-02-08 VITALS — BP 113/76 | HR 76 | Ht 61.0 in | Wt 153.0 lb

## 2021-02-08 DIAGNOSIS — Z113 Encounter for screening for infections with a predominantly sexual mode of transmission: Secondary | ICD-10-CM

## 2021-02-08 DIAGNOSIS — Z30011 Encounter for initial prescription of contraceptive pills: Secondary | ICD-10-CM

## 2021-02-08 DIAGNOSIS — Z3009 Encounter for other general counseling and advice on contraception: Secondary | ICD-10-CM

## 2021-02-08 DIAGNOSIS — Z01419 Encounter for gynecological examination (general) (routine) without abnormal findings: Secondary | ICD-10-CM | POA: Insufficient documentation

## 2021-02-08 MED ORDER — NORETHIN ACE-ETH ESTRAD-FE 1-20 MG-MCG PO TABS: 1.0000 | ORAL_TABLET | Freq: Every day | ORAL | 11 refills | Status: DC

## 2021-02-08 NOTE — Progress Notes (Signed)
Patient ID: Vicki Maxwell, female   DOB: Sep 25, 1995, 25 y.o.   MRN: [id number] History of Present Illness: Vicki Maxwell is a 25 year old white female,single, G1P1 in for a well woman gyn exam and pap.    Current Medications, Allergies, Past Medical History, Past Surgical History, Family History and Social History were reviewed in [employer] record.     Review of Systems: Patient denies any headaches, hearing loss, fatigue, blurred vision, shortness of breath, chest pain, abdominal pain, problems with bowel movements, urination, or intercourse(she has not has sex since son born she says). No joint pain or mood swings.  Had some pain in mid thoracic area of back, in the summer none now    Physical Exam:BP 113/76 (BP Location: Left Arm, Patient Position: Sitting, Cuff Size: Normal)   Pulse 76   Ht 5\' 1"  (1.549 m)   Wt 153 lb (69.4 kg)   LMP 01/25/2021 (Exact Date)   Breastfeeding No   BMI 28.91 kg/m   General:  Well developed, well nourished, no acute distress Skin:  Warm and dry Neck:  Midline trachea, normal thyroid, good ROM, no lymphadenopathy Lungs; Clear to auscultation bilaterally Breast:  No dominant palpable mass, retraction, or nipple discharge,has 3 mm mole left underarm Cardiovascular: Regular rate and rhythm Abdomen:  Soft, non tender, no hepatosplenomegaly Pelvic:  External genitalia is normal in appearance, no lesions.  The vagina is normal in appearance. Urethra has no lesions or masses. The cervix is bulbous. Pap with GC/CHL and HR HPV genotyping performed. Uterus is felt to be normal size, shape, and contour.  No adnexal masses or tenderness noted. Bladder is non tender. Extremities/musculoskeletal:  No swelling or varicosities noted, no clubbing or cyanosis Psych:  No mood changes, alert and cooperative,seems happy AA is 1 Fall risk is low Depression screen [facility] 2/9 02/08/2021 05/29/2017  Decreased Interest 1 0  Down, Depressed, Hopeless 1 2  PHQ  - 2 Score 2 2  Altered sleeping 1 1  Tired, decreased energy 1 2  Change in appetite 0 1  Feeling bad or failure about yourself  1 3  Trouble concentrating 0 0  Moving slowly or fidgety/restless 0 0  Suicidal thoughts 0 0  PHQ-9 Score 5 9    GAD 7 : Generalized Anxiety Score 02/08/2021  Nervous, Anxious, on Edge 1  Control/stop worrying 1  Worry too much - different things 1  Trouble relaxing 1  Restless 0  Easily annoyed or irritable 0  Afraid - awful might happen 0  Total GAD 7 Score 4      Upstream - 02/08/21 1334       Pregnancy Intention Screening   Does the patient want to become pregnant in the next year? No    Does the patient's partner want to become pregnant in the next year? No    Would the patient like to discuss contraceptive options today? No      Contraception Wrap Up   Current Method Abstinence    End Method Female Condom;Oral Contraceptive    Contraception Counseling Provided Yes            Examination chaperoned by  04/10/21 RN   Impression and Plan: 1. Encounter for gynecological examination with Papanicolaou smear of cervix Pap sent Physical in 1 year Pap in 3 if normal  She declines any labs   2. Family planning   3. Screening examination for STD (sexually transmitted disease) GC/CHL on pap along with HR HPV genotyping  sent  4. Encounter for initial prescription of contraceptive pills She wants to get on birth control pills, denies any MI,stroke,DVT or breast cancer or migraine with aura Will rx junel 1/20 start with next period and use condoms for at least 1 month  She declines HIV and RPR was non reactive  2019, no sex since then

## 2021-02-13 LAB — CYTOLOGY - PAP
Chlamydia: NEGATIVE
Comment: NEGATIVE
Comment: NEGATIVE
Comment: NORMAL
Diagnosis: NEGATIVE
High risk HPV: NEGATIVE
Neisseria Gonorrhea: NEGATIVE

## 2022-06-06 ENCOUNTER — Ambulatory Visit (INDEPENDENT_AMBULATORY_CARE_PROVIDER_SITE_OTHER): Payer: BC Managed Care – PPO | Admitting: Adult Health

## 2022-06-06 ENCOUNTER — Encounter: Payer: Self-pay | Admitting: Adult Health

## 2022-06-06 VITALS — BP 116/74 | HR 69 | Ht 62.0 in | Wt 146.0 lb

## 2022-06-06 DIAGNOSIS — Z3009 Encounter for other general counseling and advice on contraception: Secondary | ICD-10-CM

## 2022-06-06 DIAGNOSIS — Z3202 Encounter for pregnancy test, result negative: Secondary | ICD-10-CM

## 2022-06-06 DIAGNOSIS — Z01419 Encounter for gynecological examination (general) (routine) without abnormal findings: Secondary | ICD-10-CM

## 2022-06-06 LAB — POCT URINE PREGNANCY: Preg Test, Ur: NEGATIVE

## 2022-06-06 NOTE — Progress Notes (Signed)
Patient ID: Vicki Maxwell, female   DOB: 01-Dec-1995, 27 y.o.   MRN: [id number] History of Present Illness: Vicki Maxwell is a 27 year old white female,single, G1P1001 in for a well woman gyn exam. She wants to discuss birth control.  Last pap was negative HPV,NILM 02/08/21.    Current Medications, Allergies, Past Medical History, Past Surgical History, Family History and Social History were reviewed in [employer] record.     Review of Systems: Patient denies any headaches, hearing loss, fatigue, blurred vision, shortness of breath, chest pain, abdominal pain, problems with bowel movements, urination, or intercourse.(Not active). No joint pain or mood swings.    Physical Exam:BP 116/74 (BP Location: Left Arm, Patient Position: Sitting, Cuff Size: Normal)   Pulse 69   Ht 5\' 2"  (1.575 m)   Wt 146 lb (66.2 kg)   LMP 05/15/2022   BMI 26.70 kg/m  UPT is negative. General:  Well developed, well nourished, no acute distress Skin:  Warm and dry Neck:  Midline trachea, normal thyroid, good ROM, no lymphadenopathy Lungs; Clear to auscultation bilaterally Breast:  No dominant palpable mass, retraction, or nipple discharge Cardiovascular: Regular rate and rhythm Abdomen:  Soft, non tender, no hepatosplenomegaly Pelvic:  External genitalia is normal in appearance, no lesions.  The vagina is normal in appearance. Urethra has no lesions or masses. The cervix is smooth.  Uterus is felt to be normal size, shape, and contour.  No adnexal masses or tenderness noted.Bladder is non tender, no masses felt. Extremities/musculoskeletal:  No swelling or varicosities noted, no clubbing or cyanosis Psych:  No mood changes, alert and cooperative,seems happy AA is 1 Fall risk is low    06/06/2022    1:47 PM 02/08/2021    1:34 PM 05/29/2017    3:08 PM  Depression screen PHQ 2/9  Decreased Interest 2 1 0  Down, Depressed, Hopeless 2 1 2   PHQ - 2 Score 4 2 2   Altered sleeping 0 1 1  Tired,  decreased energy 0 1 2  Change in appetite 0 0 1  Feeling bad or failure about yourself  0 1 3  Trouble concentrating 0 0 0  Moving slowly or fidgety/restless 0 0 0  Suicidal thoughts 0 0 0  PHQ-9 Score 4 5 9        06/06/2022    1:48 PM 02/08/2021    1:34 PM  GAD 7 : Generalized Anxiety Score  Nervous, Anxious, on Edge 1 1  Control/stop worrying 0 1  Worry too much - different things 1 1  Trouble relaxing 0 1  Restless 0 0  Easily annoyed or irritable 0 0  Afraid - awful might happen 0 0  Total GAD 7 Score 2 4    Upstream - 06/06/22 1353       Pregnancy Intention Screening   Does the patient want to become pregnant in the next year? No    Does the patient's partner want to become pregnant in the next year? No    Would the patient like to discuss contraceptive options today? Yes      Contraception Wrap Up   Current Method Abstinence;Female Condom    End Method Abstinence    Contraception Counseling Provided Yes              Examination chaperoned by Levy Pupa LPN  Impression and Plan: 1. Pregnancy examination or test, negative result  2. Encounter for well woman exam with routine gynecological exam Pap and physical in 1  year  3. General counseling and advice on contraceptive management She is interested in [place], discussed pros and cons, handout given No sex til after nexplanon inserted  Return 06/27/22 at 3:50 pm for nexplanon insertion with me

## 2022-06-27 ENCOUNTER — Ambulatory Visit (INDEPENDENT_AMBULATORY_CARE_PROVIDER_SITE_OTHER): Payer: BC Managed Care – PPO | Admitting: Adult Health

## 2022-06-27 ENCOUNTER — Encounter: Payer: Self-pay | Admitting: Adult Health

## 2022-06-27 VITALS — BP 118/73 | HR 66 | Ht 62.0 in | Wt 147.0 lb

## 2022-06-27 DIAGNOSIS — Z3202 Encounter for pregnancy test, result negative: Secondary | ICD-10-CM | POA: Diagnosis not present

## 2022-06-27 DIAGNOSIS — Z30017 Encounter for initial prescription of implantable subdermal contraceptive: Secondary | ICD-10-CM

## 2022-06-27 LAB — POCT URINE PREGNANCY: Preg Test, Ur: NEGATIVE

## 2022-06-27 MED ORDER — ETONOGESTREL 68 MG ~~LOC~~ IMPL
68.0000 mg | DRUG_IMPLANT | Freq: Once | SUBCUTANEOUS | Status: AC
  Administered 2022-06-27: 68 mg via SUBCUTANEOUS

## 2022-06-27 NOTE — Addendum Note (Signed)
Addended by: Linton Rump on: 06/27/2022 05:01 PM   Modules accepted: Orders

## 2022-06-27 NOTE — Progress Notes (Signed)
  Subjective:     Patient ID: Vicki Maxwell, female   DOB: 1995-12-13, 27 y.o.   MRN: [id number]  HPI Vicki Maxwell is a 27 year old Hispanic female,single, G1P1001, in for a nexplanon insertion.     Component Value Date/Time   DIAGPAP  02/08/2021 1400    - Negative for intraepithelial lesion or malignancy (NILM)   HPVHIGH Negative 02/08/2021 1400   ADEQPAP  02/08/2021 1400    Satisfactory for evaluation; transformation zone component PRESENT.    Review of Systems For nexplanon insertion Reviewed past medical,surgical, social and family history. Reviewed medications and allergies.     Objective:   Physical Exam BP 118/73 (BP Location: Left Arm, Patient Position: Sitting, Cuff Size: Normal)   Pulse 66   Ht 5' 2"$  (1.575 m)   Wt 147 lb (66.7 kg)   LMP 06/12/2022   BMI 26.89 kg/m  UPT is negative Consent signed, time out called. Left arm cleansed with betadine, and injected with 1.5 cc 2% lidocaine and waited til numb. Nexplanon easily inserted and steri strips applied.Rod easily palpated by provider and pt. Pressure dressing applied.     Assessment:      1. Pregnancy examination or test, negative result  2. Insertion of Nexplanon Lot [id number] Exp 2025-11    Use condoms x 2 weeks, keep clean and dry x 24 hours, no heavy lifting, keep steri strips on x 72 hours, Keep pressure dressing on x 24 hours. Follow up prn problems.  Plan:     Remove nexplanon in 3 years or sooner if desired

## 2022-06-27 NOTE — Patient Instructions (Signed)
Use condoms x 2 weeks, keep clean and dry x 24 hours, no heavy lifting, keep steri strips on x 72 hours, Keep pressure dressing on x 24 hours. Follow up prn problems.
